# Patient Record
Sex: Female | Born: 1955 | Race: White | Hispanic: No | Marital: Married | Smoking: Never smoker
Health system: Southern US, Community
[De-identification: ages and names within clinical notes are randomized; demographics above are authoritative.]

## PROBLEM LIST (undated history)

## (undated) DIAGNOSIS — Z87442 Personal history of urinary calculi: Secondary | ICD-10-CM

## (undated) DIAGNOSIS — K439 Ventral hernia without obstruction or gangrene: Secondary | ICD-10-CM

## (undated) DIAGNOSIS — F419 Anxiety disorder, unspecified: Secondary | ICD-10-CM

## (undated) DIAGNOSIS — K219 Gastro-esophageal reflux disease without esophagitis: Secondary | ICD-10-CM

## (undated) DIAGNOSIS — Z8719 Personal history of other diseases of the digestive system: Secondary | ICD-10-CM

## (undated) DIAGNOSIS — R609 Edema, unspecified: Secondary | ICD-10-CM

## (undated) HISTORY — PX: LAPAROSCOPY: SHX197

## (undated) HISTORY — PX: CHOLECYSTECTOMY: SHX55

## (undated) HISTORY — PX: HERNIA REPAIR: SHX51

## (undated) HISTORY — PX: BLADDER SUSPENSION: SHX72

## (undated) HISTORY — PX: ABDOMINAL HERNIA REPAIR: SHX539

---

## 1987-07-19 HISTORY — PX: ABDOMINAL HYSTERECTOMY: SHX81

## 2010-06-20 ENCOUNTER — Emergency Department (HOSPITAL_BASED_OUTPATIENT_CLINIC_OR_DEPARTMENT_OTHER)
Admission: EM | Admit: 2010-06-20 | Discharge: 2010-06-20 | Payer: Self-pay | Source: Home / Self Care | Admitting: Emergency Medicine

## 2011-03-28 DIAGNOSIS — R55 Syncope and collapse: Secondary | ICD-10-CM

## 2012-08-20 ENCOUNTER — Encounter (INDEPENDENT_AMBULATORY_CARE_PROVIDER_SITE_OTHER): Payer: Self-pay | Admitting: Surgery

## 2012-08-23 ENCOUNTER — Ambulatory Visit (INDEPENDENT_AMBULATORY_CARE_PROVIDER_SITE_OTHER): Payer: Self-pay | Admitting: Surgery

## 2012-08-27 ENCOUNTER — Ambulatory Visit (INDEPENDENT_AMBULATORY_CARE_PROVIDER_SITE_OTHER): Payer: BC Managed Care – PPO | Admitting: Surgery

## 2012-08-27 ENCOUNTER — Encounter (INDEPENDENT_AMBULATORY_CARE_PROVIDER_SITE_OTHER): Payer: Self-pay | Admitting: Surgery

## 2012-08-27 VITALS — BP 148/88 | HR 76 | Temp 97.8°F | Resp 16 | Ht 64.5 in | Wt 186.8 lb

## 2012-08-27 DIAGNOSIS — G8929 Other chronic pain: Secondary | ICD-10-CM | POA: Insufficient documentation

## 2012-08-27 DIAGNOSIS — Z9889 Other specified postprocedural states: Secondary | ICD-10-CM | POA: Insufficient documentation

## 2012-08-27 DIAGNOSIS — Z8719 Personal history of other diseases of the digestive system: Secondary | ICD-10-CM | POA: Insufficient documentation

## 2012-08-27 DIAGNOSIS — R1013 Epigastric pain: Secondary | ICD-10-CM

## 2012-08-27 HISTORY — DX: Personal history of other diseases of the digestive system: Z87.19

## 2012-08-27 NOTE — Progress Notes (Signed)
Patient ID: Rita Diaz, female   DOB: 1955/09/04, 57 y.o.   MRN: [id number]  Chief Complaint  Patient presents with  . Hernia    new pt- eval abd wall hernia    HPI [name] is a 57 y.o. female.  Referred by Dr. Leodis Sias, [facility] HPI [name] who is status post open ventral hernia repair with mesh in 2010 by Dr. Ernestine Conrad [place].  Previously she had had a mesh repair of a primary ventral hernia in the 90s.  She has also had a laparoscopic cholecystectomy. For the last couple of years she has had a lot of pain along the sides of her midline incision. It hurts to bend over or to turn. She denies any GI symptoms. She denies any constipation. According to her report, she was reevaluated by Dr. [name] as who stated that his only option was to remove the hernia mesh but that this would likely result in a recurrent hernia. The patient does not remember when she had her last CT scan. We have no records from her previous surgery immediately available. Her primary care physician has referred her to [name] for a second opinion.  History reviewed. No pertinent past medical history.  Past Surgical History  Procedure Laterality Date  . Abdominal hysterectomy  1989  . Abdominal hernia repair  1996, 2008  . Laparoscopy      x3  . Cesarean section  1985  . Bladder suspension    . Cholecystectomy      History reviewed. No pertinent family history.  Social History History  Substance Use Topics  . Smoking status: Never Smoker   . Smokeless tobacco: Not on file  . Alcohol Use: No    Allergies  Allergen Reactions  . Aspirin     No current outpatient prescriptions on file.   No current facility-administered medications for this visit.    Review of Systems Review of Systems  Constitutional: Negative for fever, chills and unexpected weight change.  HENT: Negative for hearing loss, congestion, sore throat, trouble swallowing and voice  change.   Eyes: Negative for visual disturbance.  Respiratory: Negative for cough and wheezing.   Cardiovascular: Negative for chest pain, palpitations and leg swelling.  Gastrointestinal: Positive for abdominal pain. Negative for nausea, vomiting, diarrhea, constipation, blood in stool, abdominal distention and anal bleeding.  Genitourinary: Negative for hematuria, vaginal bleeding and difficulty urinating.  Musculoskeletal: Negative for arthralgias.  Skin: Negative for rash and wound.  Neurological: Negative for seizures, syncope and headaches.  Hematological: Negative for adenopathy. Does not bruise/bleed easily.  Psychiatric/Behavioral: Negative for confusion.    Blood pressure 148/88, pulse 76, temperature 97.8 F (36.6 C), temperature source Temporal, resp. rate 16, height 5' 4.5" (1.638 m), weight 186 lb 12.8 oz (84.732 kg).  Physical Exam Physical Exam WDWN in NAD HEENT:  EOMI, sclera anicteric Neck:  No masses, no thyromegaly Lungs:  CTA bilaterally; normal respiratory effort CV:  Regular rate and rhythm; no murmurs Abd:  +bowel sounds, soft, Healed midline incision. There is a slight bulge in the middle of this incision above the umbilicus. However this bulge does not enlarge with Valsalva maneuver. I cannot palpate a mass behind this area. There is no sign of recurrent ventral hernia physical examination. She is tender along her incision and the surrounding 3 cm in each direction. Ext:  Well-perfused; no edema Skin:  Warm, dry; no sign of jaundice  Data Reviewed We  were able to obtain an operative report from Dr. Ashley Jacobs. We are awaiting a CT scan report. His surgery was on 09/2608. There was a 7 cm defect repaired with 14 x 10 cm mesh. It is unclear exactly what type of mesh was used. The fascia was closed anterior to the mesh with PDS suture.  Assessment    Abdominal pain after ventral hernia repair.  No sign of recurrent ventral hernia on examination.     Plan    We  will try to obtain records and CT scan from Dr. Ashley Jacobs. She has not had a CT scan in several years and we will obtain a CT scan to make sure that the mesh remains in good place and that there is no sign of recurrent hernia. We will contact the patient after we have reviewed the records to see whether she is a CT scan. In the meantime I encouraged her to use the abdominal binder for support.        [name] K. 08/27/2012, 1:29 PM

## 2012-08-29 ENCOUNTER — Ambulatory Visit
Admission: RE | Admit: 2012-08-29 | Discharge: 2012-08-29 | Disposition: A | Discharge status: Home / Self Care | Payer: BC Managed Care – PPO | Source: Ambulatory Visit | Attending: Surgery | Admitting: Surgery

## 2012-08-29 ENCOUNTER — Other Ambulatory Visit (INDEPENDENT_AMBULATORY_CARE_PROVIDER_SITE_OTHER): Payer: Self-pay | Admitting: Surgery

## 2012-08-29 DIAGNOSIS — Z8719 Personal history of other diseases of the digestive system: Secondary | ICD-10-CM

## 2012-08-29 DIAGNOSIS — G8929 Other chronic pain: Secondary | ICD-10-CM

## 2012-08-29 DIAGNOSIS — R1013 Epigastric pain: Secondary | ICD-10-CM

## 2012-08-29 DIAGNOSIS — Z9889 Other specified postprocedural states: Secondary | ICD-10-CM

## 2012-08-29 MED ORDER — IOHEXOL 300 MG/ML  SOLN
100.0000 mL | Freq: Once | INTRAMUSCULAR | Status: AC | PRN
  Administered 2012-08-29: 100 mL via INTRAVENOUS

## 2012-09-03 ENCOUNTER — Telehealth (INDEPENDENT_AMBULATORY_CARE_PROVIDER_SITE_OTHER): Payer: Self-pay | Admitting: General Surgery

## 2012-09-03 NOTE — Telephone Encounter (Signed)
LMOM for patient to call back and ask for Rita Diaz 

## 2012-09-03 NOTE — Telephone Encounter (Signed)
Called patient and told her that you want to talk to the radiologist yourself when you got back in town and she was ok with that, and she know that it will be next week and get a call from you or me

## 2012-09-11 ENCOUNTER — Ambulatory Visit (INDEPENDENT_AMBULATORY_CARE_PROVIDER_SITE_OTHER): Payer: BC Managed Care – PPO | Admitting: Surgery

## 2012-09-11 ENCOUNTER — Encounter (HOSPITAL_COMMUNITY): Payer: Self-pay | Admitting: Pharmacy Technician

## 2012-09-11 ENCOUNTER — Encounter (INDEPENDENT_AMBULATORY_CARE_PROVIDER_SITE_OTHER): Payer: Self-pay | Admitting: General Surgery

## 2012-09-11 ENCOUNTER — Encounter (INDEPENDENT_AMBULATORY_CARE_PROVIDER_SITE_OTHER): Payer: Self-pay | Admitting: Surgery

## 2012-09-11 VITALS — BP 138/82 | HR 72 | Temp 98.4°F | Resp 14 | Ht 64.5 in | Wt 188.2 lb

## 2012-09-11 DIAGNOSIS — G8929 Other chronic pain: Secondary | ICD-10-CM

## 2012-09-11 DIAGNOSIS — R1013 Epigastric pain: Secondary | ICD-10-CM

## 2012-09-11 DIAGNOSIS — Z8719 Personal history of other diseases of the digestive system: Secondary | ICD-10-CM

## 2012-09-11 DIAGNOSIS — Z9889 Other specified postprocedural states: Secondary | ICD-10-CM

## 2012-09-11 NOTE — Progress Notes (Signed)
The patient comes in today for discussion of her CT scan. I had a discussion with the radiologist. We can see the ventral hernia mesh. The left edge of the mesh does not seem to be adherent to the abdominal wall. There may be some fat that has herniated around the edge of the mesh. This is a fairly subtle finding but may explain her symptoms. She agrees that most of her symptoms are to the left of her incision. I showed the patient the images on the screen and she understands these findings.  Otherwise the scan was fairly normal.  We discussed options for treatment. I offered the patient a laparoscopic exploration with possible revision of her hernia repair. Hopefully we can reduce the fat that is herniated around the left edge of her mesh and can resecure the mesh to the fascia. I explained the possibility for a need to convert to an open procedure if there is too much intra-abdominal scar tissue to allow proper visualization with the laparoscope.The surgical procedure has been discussed with the patient.  Potential risks, benefits, alternative treatments, and expected outcomes have been explained.  All of the patient's questions at this time have been answered.  The likelihood of reaching the patient's treatment goal is good.  The patient understand the proposed surgical procedure and wishes to proceed.  Vernon Ariel K. Shakerria Parran, MD, FACS [facility]  09/11/2012 11:50 AM       Old note 08/27/12   Patient ID: [place] T Noble, female DOB: 09/19/1955, 57 y.o. MRN: [id number]  Chief Complaint   Patient presents with   .  Hernia     new pt- eval abd wall hernia   HPI  Tanee T Quimby is a 57 y.o. female. Referred by Dr. Francis Wong, [facility]  HPI  [name] who is status post open ventral hernia repair with mesh in 2010 [place]. Previously she had had a mesh repair of a primary ventral hernia in the 90s. She has also had a  laparoscopic cholecystectomy. For the last couple of years she has had a lot of pain along the sides of her midline incision. It hurts to bend over or to turn. She denies any GI symptoms. She denies any constipation. According to her report, she was reevaluated by Dr. Ray as who stated that his only option was to remove the hernia mesh but that this would likely result in a recurrent hernia. The patient does not remember when she had her last CT scan. We have no records from her previous surgery immediately available. Her primary care physician has referred her to us for a second opinion.  History reviewed. No pertinent past medical history.  Past Surgical History   Procedure  Laterality  Date   .  Abdominal hysterectomy   1989   .  Abdominal hernia repair   1996, 2008   .  Laparoscopy       x3   .  Cesarean section   1985   .  Bladder suspension     .  Cholecystectomy     History reviewed. No pertinent family history.  Social History  History   Substance Use Topics   .  Smoking status:  Never Smoker   .  Smokeless tobacco:  Not on file   .  Alcohol Use:  No    Allergies   Allergen  Reactions   .  Aspirin       No current outpatient prescriptions on file.    No current facility-administered medications for this visit.   Review of Systems  Review of Systems  Constitutional: Negative for fever, chills and unexpected weight change.  HENT: Negative for hearing loss, congestion, sore throat, trouble swallowing and voice change.  Eyes: Negative for visual disturbance.  Respiratory: Negative for cough and wheezing.  Cardiovascular: Negative for chest pain, palpitations and leg swelling.  Gastrointestinal: Positive for abdominal pain. Negative for nausea, vomiting, diarrhea, constipation, blood in stool, abdominal distention and anal bleeding.  Genitourinary: Negative for hematuria, vaginal bleeding and difficulty urinating.  Musculoskeletal: Negative for arthralgias.  Skin: Negative for  rash and wound.  Neurological: Negative for seizures, syncope and headaches.  Hematological: Negative for adenopathy. Does not bruise/bleed easily.  Psychiatric/Behavioral: Negative for confusion.  Blood pressure 148/88, pulse 76, temperature 97.8 F (36.6 C), temperature source Temporal, resp. rate 16, height 5' 4.5" (1.638 m), weight 186 lb 12.8 oz (84.732 kg).  Physical Exam  Physical Exam  WDWN in NAD  HEENT: EOMI, sclera anicteric  Neck: No masses, no thyromegaly  Lungs: CTA bilaterally; normal respiratory effort  CV: Regular rate and rhythm; no murmurs  Abd: +bowel sounds, soft, Healed midline incision. There is a slight bulge in the middle of this incision above the umbilicus. However this bulge does not enlarge with Valsalva maneuver. I cannot palpate a mass behind this area. There is no sign of recurrent ventral hernia physical examination. She is tender along her incision and the surrounding 3 cm in each direction.  Ext: Well-perfused; no edema  Skin: Warm, dry; no sign of jaundice  Data Reviewed  We were able to obtain an operative report from Dr. Reyes. We are awaiting a CT scan report. His surgery was on 09/2608. There was a 7 cm defect repaired with 14 x 10 cm mesh. It is unclear exactly what type of mesh was used. The fascia was closed anterior to the mesh with PDS suture.  Assessment  Abdominal pain after ventral hernia repair. No sign of recurrent ventral hernia on examination.  Plan  We will try to obtain records and CT scan from Dr. Reyes. She has not had a CT scan in several years and we will obtain a CT scan to make sure that the mesh remains in good place and that there is no sign of recurrent hernia. We will contact the patient after we have reviewed the records to see whether she is a CT scan. In the meantime I encouraged her to use the abdominal binder for support.  Paitlyn Mcclatchey K.  08/27/2012, 1:29 PM  

## 2012-09-17 ENCOUNTER — Other Ambulatory Visit (HOSPITAL_COMMUNITY): Payer: BC Managed Care – PPO

## 2012-09-18 ENCOUNTER — Encounter (HOSPITAL_COMMUNITY): Payer: Self-pay

## 2012-09-18 ENCOUNTER — Encounter (HOSPITAL_COMMUNITY)
Admission: RE | Admit: 2012-09-18 | Discharge: 2012-09-18 | Disposition: A | Discharge status: Home / Self Care | Payer: BC Managed Care – PPO | Source: Ambulatory Visit | Attending: Surgery | Admitting: Surgery

## 2012-09-18 ENCOUNTER — Other Ambulatory Visit (INDEPENDENT_AMBULATORY_CARE_PROVIDER_SITE_OTHER): Payer: Self-pay | Admitting: Surgery

## 2012-09-18 HISTORY — DX: Gastro-esophageal reflux disease without esophagitis: K21.9

## 2012-09-18 HISTORY — DX: Personal history of other diseases of the digestive system: Z87.19

## 2012-09-18 LAB — CBC
Hemoglobin: 14.5 g/dL (ref 12.0–15.0)
MCH: 30.3 pg (ref 26.0–34.0)
MCHC: 35.3 g/dL (ref 30.0–36.0)
WBC: 5.6 10*3/uL (ref 4.0–10.5)

## 2012-09-18 MED ORDER — VANCOMYCIN HCL 10 G IV SOLR: 1000.0000 mg | Freq: Once | INTRAVENOUS | Status: DC

## 2012-09-18 MED ORDER — CEFAZOLIN SODIUM-DEXTROSE 2-3 GM-% IV SOLR
2.0000 g | INTRAVENOUS | Status: AC
  Administered 2012-09-19: 2 g via INTRAVENOUS
  Filled 2012-09-18: qty 50

## 2012-09-18 NOTE — Pre-Procedure Instructions (Signed)
Rita Diaz  09/18/2012   Your procedure is scheduled on:  Wednesday, March 5th.  Report to [facility] at 6:30 AM.  Call this number if you have problems the morning of surgery: [phone]   Remember:   Do not eat food or drink liquids after midnight.   Take these medicines the morning of surgery with A SIP OF WATER: None.  Stop taking Aspirin, Coumadin, Plavix, Effient and Herbal medications.  Do not take any NSAIDs ie: Ibuprofen,  Advil,Naproxen or any medication containing Aspirin.   Do not wear jewelry, make-up or nail polish.  Do not wear lotions, powders, or perfumes. You may wear deodorant.  Do not shave 48 hours prior to surgery. Men may shave face and neck.  Do not bring valuables to the hospital.  Contacts, dentures or bridgework may not be worn into surgery.  Leave suitcase in the car. After surgery it may be brought to your room.  For patients admitted to the hospital, checkout time is 11:00 AM the day of  discharge.   Patients discharged the day of surgery will not be allowed to drive   home.  Name and phone number of your driver: -   Special Instructions: Shower with CHG wash (Bactoshield) tonight and again in the am prior to arriving to hospital.   Please read over the following fact sheets that you were given: Pain Booklet, Coughing and Deep Breathing and Surgical Site Infection Prevention

## 2012-09-18 NOTE — Progress Notes (Signed)
Pre-op screening positive for MRSA.  Vancomycin 1 gram prior to surgery in addition to Ancef.  Wilmon Arms. Corliss Skains, MD, [facility]  09/18/2012 4:06 PM

## 2012-09-19 ENCOUNTER — Observation Stay (HOSPITAL_COMMUNITY)
Admission: RE | Admit: 2012-09-19 | Discharge: 2012-09-20 | Disposition: A | Discharge status: Home / Self Care | Payer: BC Managed Care – PPO | Source: Ambulatory Visit | Attending: Surgery | Admitting: Surgery

## 2012-09-19 ENCOUNTER — Encounter (HOSPITAL_COMMUNITY)
Admission: RE | Disposition: A | Discharge status: Home / Self Care | Payer: Self-pay | Source: Ambulatory Visit | Attending: Surgery

## 2012-09-19 ENCOUNTER — Encounter (HOSPITAL_COMMUNITY): Payer: Self-pay | Admitting: Anesthesiology

## 2012-09-19 ENCOUNTER — Ambulatory Visit (HOSPITAL_COMMUNITY): Payer: BC Managed Care – PPO | Admitting: Anesthesiology

## 2012-09-19 DIAGNOSIS — K660 Peritoneal adhesions (postprocedural) (postinfection): Secondary | ICD-10-CM

## 2012-09-19 DIAGNOSIS — Z01812 Encounter for preprocedural laboratory examination: Secondary | ICD-10-CM | POA: Insufficient documentation

## 2012-09-19 DIAGNOSIS — R1012 Left upper quadrant pain: Secondary | ICD-10-CM | POA: Insufficient documentation

## 2012-09-19 DIAGNOSIS — G8929 Other chronic pain: Secondary | ICD-10-CM

## 2012-09-19 HISTORY — PX: VENTRAL HERNIA REPAIR: SHX424

## 2012-09-19 HISTORY — PX: LAPAROSCOPIC LYSIS OF ADHESIONS: SHX5905

## 2012-09-19 SURGERY — LYSIS, ADHESIONS, LAPAROSCOPIC
Anesthesia: General | Site: Abdomen | Wound class: Clean

## 2012-09-19 MED ORDER — CHLORHEXIDINE GLUCONATE 4 % EX LIQD: 1.0000 "application " | Freq: Once | CUTANEOUS | Status: DC

## 2012-09-19 MED ORDER — SODIUM CHLORIDE 0.9 % IV SOLN
INTRAVENOUS | Status: DC
  Administered 2012-09-19 – 2012-09-20 (×2): via INTRAVENOUS

## 2012-09-19 MED ORDER — HYDROMORPHONE HCL PF 1 MG/ML IJ SOLN
INTRAMUSCULAR | Status: AC
  Filled 2012-09-19: qty 1

## 2012-09-19 MED ORDER — ONDANSETRON HCL 4 MG PO TABS: 4.0000 mg | ORAL_TABLET | Freq: Four times a day (QID) | ORAL | Status: DC | PRN

## 2012-09-19 MED ORDER — OXYCODONE-ACETAMINOPHEN 5-325 MG PO TABS: 1.0000 | ORAL_TABLET | ORAL | Status: DC | PRN

## 2012-09-19 MED ORDER — PROPOFOL 10 MG/ML IV BOLUS
INTRAVENOUS | Status: DC | PRN
  Administered 2012-09-19: 180 mg via INTRAVENOUS

## 2012-09-19 MED ORDER — HYDROMORPHONE HCL PF 1 MG/ML IJ SOLN
1.0000 mg | INTRAMUSCULAR | Status: DC | PRN
  Administered 2012-09-19: 1 mg via INTRAVENOUS
  Filled 2012-09-19: qty 1

## 2012-09-19 MED ORDER — ROCURONIUM BROMIDE 100 MG/10ML IV SOLN
INTRAVENOUS | Status: DC | PRN
  Administered 2012-09-19: 5 mg via INTRAVENOUS
  Administered 2012-09-19: 40 mg via INTRAVENOUS
  Administered 2012-09-19: 5 mg via INTRAVENOUS

## 2012-09-19 MED ORDER — ONDANSETRON HCL 4 MG/2ML IJ SOLN
INTRAMUSCULAR | Status: DC | PRN
  Administered 2012-09-19: 4 mg via INTRAVENOUS

## 2012-09-19 MED ORDER — HEMOSTATIC AGENTS (NO CHARGE) OPTIME
TOPICAL | Status: DC | PRN
  Administered 2012-09-19: 1 via TOPICAL

## 2012-09-19 MED ORDER — ONDANSETRON HCL 4 MG/2ML IJ SOLN
INTRAMUSCULAR | Status: AC
  Administered 2012-09-19: 4 mg
  Filled 2012-09-19: qty 2

## 2012-09-19 MED ORDER — KETOROLAC TROMETHAMINE 30 MG/ML IJ SOLN
INTRAMUSCULAR | Status: AC
  Administered 2012-09-19: 30 mg
  Filled 2012-09-19: qty 1

## 2012-09-19 MED ORDER — KETOROLAC TROMETHAMINE 15 MG/ML IJ SOLN
30.0000 mg | Freq: Four times a day (QID) | INTRAMUSCULAR | Status: DC
  Administered 2012-09-19 – 2012-09-20 (×3): 30 mg via INTRAVENOUS
  Filled 2012-09-19: qty 1
  Filled 2012-09-19: qty 2
  Filled 2012-09-19: qty 1
  Filled 2012-09-19: qty 2
  Filled 2012-09-19: qty 1
  Filled 2012-09-19 (×3): qty 2
  Filled 2012-09-19: qty 1

## 2012-09-19 MED ORDER — BUPIVACAINE-EPINEPHRINE PF 0.25-1:200000 % IJ SOLN
INTRAMUSCULAR | Status: AC
  Filled 2012-09-19: qty 30

## 2012-09-19 MED ORDER — EPHEDRINE SULFATE 50 MG/ML IJ SOLN
INTRAMUSCULAR | Status: DC | PRN
  Administered 2012-09-19: 10 mg via INTRAVENOUS

## 2012-09-19 MED ORDER — LIDOCAINE HCL (CARDIAC) 20 MG/ML IV SOLN
INTRAVENOUS | Status: DC | PRN
  Administered 2012-09-19: 100 mg via INTRAVENOUS

## 2012-09-19 MED ORDER — BUPIVACAINE-EPINEPHRINE 0.25% -1:200000 IJ SOLN
INTRAMUSCULAR | Status: DC | PRN
  Administered 2012-09-19: 28 mL

## 2012-09-19 MED ORDER — VANCOMYCIN HCL IN DEXTROSE 1-5 GM/200ML-% IV SOLN
INTRAVENOUS | Status: AC
  Administered 2012-09-19: 1000 mg via INTRAVENOUS
  Filled 2012-09-19: qty 200

## 2012-09-19 MED ORDER — HYDROMORPHONE HCL PF 1 MG/ML IJ SOLN
0.2500 mg | INTRAMUSCULAR | Status: DC | PRN
  Administered 2012-09-19 (×2): 0.5 mg via INTRAVENOUS

## 2012-09-19 MED ORDER — MUPIROCIN 2 % EX OINT
TOPICAL_OINTMENT | Freq: Two times a day (BID) | CUTANEOUS | Status: DC
  Administered 2012-09-19 (×2): via NASAL
  Filled 2012-09-19 (×2): qty 22

## 2012-09-19 MED ORDER — SODIUM CHLORIDE 0.9 % IR SOLN
Status: DC | PRN
  Administered 2012-09-19: 1000 mL

## 2012-09-19 MED ORDER — MIDAZOLAM HCL 5 MG/5ML IJ SOLN
INTRAMUSCULAR | Status: DC | PRN
  Administered 2012-09-19: 2 mg via INTRAVENOUS

## 2012-09-19 MED ORDER — NEOSTIGMINE METHYLSULFATE 1 MG/ML IJ SOLN
INTRAMUSCULAR | Status: DC | PRN
  Administered 2012-09-19: 3 mg via INTRAVENOUS

## 2012-09-19 MED ORDER — GLYCOPYRROLATE 0.2 MG/ML IJ SOLN
INTRAMUSCULAR | Status: DC | PRN
  Administered 2012-09-19: 0.4 mg via INTRAVENOUS

## 2012-09-19 MED ORDER — FENTANYL CITRATE 0.05 MG/ML IJ SOLN
INTRAMUSCULAR | Status: DC | PRN
  Administered 2012-09-19 (×2): 50 ug via INTRAVENOUS

## 2012-09-19 MED ORDER — 0.9 % SODIUM CHLORIDE (POUR BTL) OPTIME
TOPICAL | Status: DC | PRN
  Administered 2012-09-19 (×2): 1000 mL

## 2012-09-19 MED ORDER — LACTATED RINGERS IV SOLN
INTRAVENOUS | Status: DC | PRN
  Administered 2012-09-19 (×2): via INTRAVENOUS

## 2012-09-19 MED ORDER — MUPIROCIN 2 % EX OINT
TOPICAL_OINTMENT | CUTANEOUS | Status: AC
  Filled 2012-09-19: qty 22

## 2012-09-19 MED ORDER — ONDANSETRON HCL 4 MG/2ML IJ SOLN: 4.0000 mg | Freq: Four times a day (QID) | INTRAMUSCULAR | Status: DC | PRN

## 2012-09-19 SURGICAL SUPPLY — 52 items
APPLICATOR COTTON TIP 6IN STRL (MISCELLANEOUS) ×3 IMPLANT
APPLIER CLIP LOGIC TI 5 (MISCELLANEOUS) IMPLANT
APPLIER CLIP ROT 10 11.4 M/L (STAPLE)
BINDER ABD UNIV 12 45-62 (WOUND CARE) IMPLANT
BINDER ABDOMINAL 46IN 62IN (WOUND CARE)
BLADE SURG ROTATE 9660 (MISCELLANEOUS) IMPLANT
CANISTER SUCTION 2500CC (MISCELLANEOUS) ×3 IMPLANT
CHLORAPREP W/TINT 26ML (MISCELLANEOUS) ×3 IMPLANT
CLIP APPLIE ROT 10 11.4 M/L (STAPLE) IMPLANT
CLOTH BEACON ORANGE TIMEOUT ST (SAFETY) ×3 IMPLANT
COVER SURGICAL LIGHT HANDLE (MISCELLANEOUS) ×3 IMPLANT
DECANTER SPIKE VIAL GLASS SM (MISCELLANEOUS) IMPLANT
DERMABOND ADHESIVE PROPEN (GAUZE/BANDAGES/DRESSINGS) ×1
DERMABOND ADVANCED .7 DNX6 (GAUZE/BANDAGES/DRESSINGS) ×2 IMPLANT
DEVICE SECURE STRAP 25 ABSORB (INSTRUMENTS) IMPLANT
DEVICE TROCAR PUNCTURE CLOSURE (ENDOMECHANICALS) ×3 IMPLANT
DRAPE UTILITY 15X26 W/TAPE STR (DRAPE) ×6 IMPLANT
DRAPE WARM FLUID 44X44 (DRAPE) ×3 IMPLANT
ELECT REM PT RETURN 9FT ADLT (ELECTROSURGICAL) ×3
ELECTRODE REM PT RTRN 9FT ADLT (ELECTROSURGICAL) ×2 IMPLANT
FILTER SMOKE EVAC LAPAROSHD (FILTER) ×3 IMPLANT
GLOVE BIO SURGEON STRL SZ7 (GLOVE) ×3 IMPLANT
GLOVE BIO SURGEON STRL SZ7.5 (GLOVE) ×3 IMPLANT
GLOVE BIOGEL PI IND STRL 7.0 (GLOVE) ×4 IMPLANT
GLOVE BIOGEL PI IND STRL 7.5 (GLOVE) ×6 IMPLANT
GLOVE BIOGEL PI INDICATOR 7.0 (GLOVE) ×2
GLOVE BIOGEL PI INDICATOR 7.5 (GLOVE) ×3
GLOVE SURG SS PI 7.0 STRL IVOR (GLOVE) ×3 IMPLANT
GOWN STRL NON-REIN LRG LVL3 (GOWN DISPOSABLE) ×9 IMPLANT
HEMOSTAT SURGICEL 2X14 (HEMOSTASIS) ×3 IMPLANT
KIT BASIN OR (CUSTOM PROCEDURE TRAY) ×3 IMPLANT
KIT ROOM TURNOVER OR (KITS) ×3 IMPLANT
MARKER SKIN DUAL TIP RULER LAB (MISCELLANEOUS) ×3 IMPLANT
NEEDLE SPNL 22GX3.5 QUINCKE BK (NEEDLE) ×3 IMPLANT
NS IRRIG 1000ML POUR BTL (IV SOLUTION) ×6 IMPLANT
PAD ARMBOARD 7.5X6 YLW CONV (MISCELLANEOUS) ×6 IMPLANT
SCALPEL HARMONIC ACE (MISCELLANEOUS) ×3 IMPLANT
SCISSORS LAP 5X35 DISP (ENDOMECHANICALS) ×3 IMPLANT
SET IRRIG TUBING LAPAROSCOPIC (IRRIGATION / IRRIGATOR) ×3 IMPLANT
SLEEVE ENDOPATH XCEL 5M (ENDOMECHANICALS) ×9 IMPLANT
SUT MNCRL AB 4-0 PS2 18 (SUTURE) ×3 IMPLANT
SUT NOVA NAB GS-21 0 18 T12 DT (SUTURE) ×3 IMPLANT
TOWEL OR 17X24 6PK STRL BLUE (TOWEL DISPOSABLE) ×3 IMPLANT
TOWEL OR 17X26 10 PK STRL BLUE (TOWEL DISPOSABLE) ×3 IMPLANT
TOWEL OR NON WOVEN STRL DISP B (DISPOSABLE) ×3 IMPLANT
TRAY FOLEY CATH 14FR (SET/KITS/TRAYS/PACK) ×3 IMPLANT
TRAY FOLEY CATH 14FRSI W/METER (CATHETERS) IMPLANT
TRAY LAPAROSCOPIC (CUSTOM PROCEDURE TRAY) ×3 IMPLANT
TROCAR XCEL BLUNT TIP 100MML (ENDOMECHANICALS) IMPLANT
TROCAR XCEL NON-BLD 11X100MML (ENDOMECHANICALS) IMPLANT
TROCAR XCEL NON-BLD 5MMX100MML (ENDOMECHANICALS) ×3 IMPLANT
WATER STERILE IRR 1000ML POUR (IV SOLUTION) IMPLANT

## 2012-09-19 NOTE — Transfer of Care (Signed)
Immediate Anesthesia Transfer of Care Note  Patient: Rita Diaz  Procedure(s) Performed: Procedure(s): LAPAROSCOPIC LYSIS OF ADHESIONS (N/A)  Patient Location: PACU  Anesthesia Type:General  Level of Consciousness: awake, alert  and oriented  Airway & Oxygen Therapy: Patient Spontanous Breathing and Patient connected to nasal cannula oxygen  Post-op Assessment: Report given to PACU RN and Post -op Vital signs reviewed and stable  Post vital signs: Reviewed and stable  Complications: No apparent anesthesia complications

## 2012-09-19 NOTE — Op Note (Signed)
Preop diagnosis: Severe left upper quadrant abdominal pain with possible recurrent ventral hernia Postop diagnosis: Extensive intra-abdominal adhesions with no sign of recurrent ventral hernia Procedure performed: Laparoscopic lysis of adhesions greater than 90 minutes Surgeon:TSUEI,MATTHEW K. Anesthesia: Gen. Endotracheal Indications: This is a 77 shows female who is status post open ventral hernia repair with mesh. This was performed by another surgeon at [facility]. The patient has experienced worsening pain in her left upper quadrant.  A CT scan showed no sign of recurrent hernia but there was some question whether the edge of the mesh was adherent to the abdominal wall. I offered her a diagnostic laparoscopy with examination of the mesh.  Description of procedure: The patient was brought to the operating room and placed in the supine position on the operating room table. After an adequate level of general anesthesia was obtained, a Foley catheter was placed under sterile technique. The patient's abdomen was prepped with chlor prep and draped in sterile fashion. A timeout was taken to ensure the proper patient proper procedure. We infiltrated the area below the left costal margin with quarter percent Marcaine and made a 5 mm incision.A 5 mm Optiview trocar was used to cannulate the peritoneal cavity. We were able to obtain insufflation of the abdomen but upon entering with the laparoscope it was obvious that there were a lot of adhesions in this area. I cannot find a clear spot. Therefore we did another 5 mm Optiview trocar in the right upper quadrant. We entered the peritoneal cavity. There were minimal adhesions on the right side. We placed 2 additional 5 mm ports on the patient's right side. We then spent the next hour and a half lysing adhesions from the anterior abdominal wall. The omentum was densely adherent to the mesh that was placed. The mesh seem to be intact and there was no  sign of recurrent hernia. In the left upper quadrant the left lobe of the liver who was densely adherent to the upper edge of the mesh. We used the harmonic scalpel to take the liver down away from the mesh. We also divided the falciform ligament. Once we had cleared the anterior abdominal wall we examined the liver. There was some disruption of the anterior capsule and this area was packed with Surgicel. We carefully examined the edges of the hernia mesh and that did not seem to be any recurrent hernia. We irrigated thoroughly and inspected for hemostasis. We infiltrated all port sites with quarter percent Marcaine with epinephrine. Pneumoperitoneum was released as the trocars were removed. 4 Monocryl was used to close the skin. Dermabond was used to seal the incisions. The patient was then extubated and brought to recovery in stable condition. All sponge, instrument, and needle counts are correct.  Wilmon Arms. Corliss Skains, MD, [facility]  09/19/2012 10:37 AM

## 2012-09-19 NOTE — Progress Notes (Signed)
Orthopedic Tech Progress Note Patient Details:  Rita Diaz [birth year] [id number]  Ortho Devices Type of Ortho Device: Abdominal binder Ortho Device/Splint Interventions: Application   Cammer, Mickie Bail 09/19/2012, 1:21 PM

## 2012-09-19 NOTE — Anesthesia Postprocedure Evaluation (Signed)
  Anesthesia Post-op Note  Patient: Rita Diaz  Procedure(s) Performed: Procedure(s): LAPAROSCOPIC LYSIS OF ADHESIONS (N/A)  Patient Location: PACU  Anesthesia Type:General  Level of Consciousness: awake  Airway and Oxygen Therapy: Patient Spontanous Breathing  Post-op Pain: mild  Post-op Assessment: Post-op Vital signs reviewed  Post-op Vital Signs: Reviewed  Complications: No apparent anesthesia complications

## 2012-09-19 NOTE — Interval H&P Note (Signed)
History and Physical Interval Note:  09/19/2012 7:52 AM  Rita Diaz  has presented today for surgery, with the diagnosis of recurrent ventral hernia  The various methods of treatment have been discussed with the patient and family. After consideration of risks, benefits and other options for treatment, the patient has consented to  Procedure(s): LAPAROSCOPIC RECURRENT  VENTRAL HERNIA , POSSIBLE OPEN REPAIR WITH MESH (N/A) INSERTION OF MESH (N/A) as a surgical intervention .  The patient's history has been reviewed, patient examined, no change in status, stable for surgery.  I have reviewed the patient's chart and labs.  Questions were answered to the patient's satisfaction.     Annalea Alguire K.

## 2012-09-19 NOTE — Preoperative (Signed)
Beta Blockers   Reason not to administer Beta Blockers:Not Applicable 

## 2012-09-19 NOTE — Anesthesia Preprocedure Evaluation (Addendum)
Anesthesia Evaluation  Patient identified by MRN, date of birth, ID band Patient awake    Reviewed: Allergy & Precautions, H&P , NPO status , Patient's Chart, lab work & pertinent test results  Airway Mallampati: II TM Distance: >3 FB Neck ROM: Full    Dental  (+) Teeth Intact and Dental Advisory Given   Pulmonary neg pulmonary ROS,  breath sounds clear to auscultation        Cardiovascular negative cardio ROS  Rhythm:Regular Rate:Normal     Neuro/Psych    GI/Hepatic Neg liver ROS, hiatal hernia, GERD-  ,  Endo/Other  negative endocrine ROS  Renal/GU Renal disease     Musculoskeletal   Abdominal   Peds  Hematology negative hematology ROS (+)   Anesthesia Other Findings   Reproductive/Obstetrics                          Anesthesia Physical Anesthesia Plan  ASA: III  Anesthesia Plan: General   Post-op Pain Management:    Induction:   Airway Management Planned: Oral ETT  Additional Equipment:   Intra-op Plan:   Post-operative Plan: Extubation in OR  Informed Consent: I have reviewed the patients History and Physical, chart, labs and discussed the procedure including the risks, benefits and alternatives for the proposed anesthesia with the patient or authorized representative who has indicated his/her understanding and acceptance.     Plan Discussed with: CRNA, Anesthesiologist and Surgeon  Anesthesia Plan Comments:         Anesthesia Quick Evaluation

## 2012-09-19 NOTE — H&P (View-Only) (Signed)
The patient comes in today for discussion of her CT scan. I had a discussion with the radiologist. We can see the ventral hernia mesh. The left edge of the mesh does not seem to be adherent to the abdominal wall. There may be some fat that has herniated around the edge of the mesh. This is a fairly subtle finding but may explain her symptoms. She agrees that most of her symptoms are to the left of her incision. I showed the patient the images on the screen and she understands these findings.  Otherwise the scan was fairly normal.  We discussed options for treatment. I offered the patient a laparoscopic exploration with possible revision of her hernia repair. Hopefully we can reduce the fat that is herniated around the left edge of her mesh and can resecure the mesh to the fascia. I explained the possibility for a need to convert to an open procedure if there is too much intra-abdominal scar tissue to allow proper visualization with the laparoscope.The surgical procedure has been discussed with the patient.  Potential risks, benefits, alternative treatments, and expected outcomes have been explained.  All of the patient's questions at this time have been answered.  The likelihood of reaching the patient's treatment goal is good.  The patient understand the proposed surgical procedure and wishes to proceed.  Wilmon Arms. Corliss Skains, MD, [facility]  09/11/2012 11:50 AM       Old note 08/27/12   Patient ID: Rita Diaz, female DOB: 04/24/1956, 57 y.o. MRN: [id number]  Chief Complaint   Patient presents with   .  Hernia     new pt- eval abd wall hernia   HPI  Rita Diaz is a 57 y.o. female. Referred by Dr. Leodis Sias, [facility]  HPI  [name] who is status post open ventral hernia repair with mesh in 2010 by Dr. Ernestine Conrad [place]. Previously she had had a mesh repair of a primary ventral hernia in the 90s. She has also had a  laparoscopic cholecystectomy. For the last couple of years she has had a lot of pain along the sides of her midline incision. It hurts to bend over or to turn. She denies any GI symptoms. She denies any constipation. According to her report, she was reevaluated by Dr. [name] as who stated that his only option was to remove the hernia mesh but that this would likely result in a recurrent hernia. The patient does not remember when she had her last CT scan. We have no records from her previous surgery immediately available. Her primary care physician has referred her to [name] for a second opinion.  History reviewed. No pertinent past medical history.  Past Surgical History   Procedure  Laterality  Date   .  Abdominal hysterectomy   1989   .  Abdominal hernia repair   1996, 2008   .  Laparoscopy       x3   .  Cesarean section   1985   .  Bladder suspension     .  Cholecystectomy     History reviewed. No pertinent family history.  Social History  History   Substance Use Topics   .  Smoking status:  Never Smoker   .  Smokeless tobacco:  Not on file   .  Alcohol Use:  No    Allergies   Allergen  Reactions   .  Aspirin  No current outpatient prescriptions on file.    No current facility-administered medications for this visit.   Review of Systems  Review of Systems  Constitutional: Negative for fever, chills and unexpected weight change.  HENT: Negative for hearing loss, congestion, sore throat, trouble swallowing and voice change.  Eyes: Negative for visual disturbance.  Respiratory: Negative for cough and wheezing.  Cardiovascular: Negative for chest pain, palpitations and leg swelling.  Gastrointestinal: Positive for abdominal pain. Negative for nausea, vomiting, diarrhea, constipation, blood in stool, abdominal distention and anal bleeding.  Genitourinary: Negative for hematuria, vaginal bleeding and difficulty urinating.  Musculoskeletal: Negative for arthralgias.  Skin: Negative for  rash and wound.  Neurological: Negative for seizures, syncope and headaches.  Hematological: Negative for adenopathy. Does not bruise/bleed easily.  Psychiatric/Behavioral: Negative for confusion.  Blood pressure 148/88, pulse 76, temperature 97.8 F (36.6 C), temperature source Temporal, resp. rate 16, height 5' 4.5" (1.638 m), weight 186 lb 12.8 oz (84.732 kg).  Physical Exam  Physical Exam  WDWN in NAD  HEENT: EOMI, sclera anicteric  Neck: No masses, no thyromegaly  Lungs: CTA bilaterally; normal respiratory effort  CV: Regular rate and rhythm; no murmurs  Abd: +bowel sounds, soft, Healed midline incision. There is a slight bulge in the middle of this incision above the umbilicus. However this bulge does not enlarge with Valsalva maneuver. I cannot palpate a mass behind this area. There is no sign of recurrent ventral hernia physical examination. She is tender along her incision and the surrounding 3 cm in each direction.  Ext: Well-perfused; no edema  Skin: Warm, dry; no sign of jaundice  Data Reviewed  We were able to obtain an operative report from Dr. Ashley Jacobs. We are awaiting a CT scan report. His surgery was on 09/2608. There was a 7 cm defect repaired with 14 x 10 cm mesh. It is unclear exactly what type of mesh was used. The fascia was closed anterior to the mesh with PDS suture.  Assessment  Abdominal pain after ventral hernia repair. No sign of recurrent ventral hernia on examination.  Plan  We will try to obtain records and CT scan from Dr. Ashley Jacobs. She has not had a CT scan in several years and we will obtain a CT scan to make sure that the mesh remains in good place and that there is no sign of recurrent hernia. We will contact the patient after we have reviewed the records to see whether she is a CT scan. In the meantime I encouraged her to use the abdominal binder for support.  Natalyia Innes K.  08/27/2012, 1:29 PM

## 2012-09-20 ENCOUNTER — Encounter (HOSPITAL_COMMUNITY): Payer: Self-pay | Admitting: Surgery

## 2012-09-20 LAB — GLUCOSE, CAPILLARY

## 2012-09-20 MED ORDER — OXYCODONE-ACETAMINOPHEN 5-325 MG PO TABS: 1.0000 | ORAL_TABLET | ORAL | Status: DC | PRN

## 2012-09-20 NOTE — Discharge Summary (Signed)
Physician Discharge Summary  Patient ID: Rita Diaz MRN: [id number] DOB/AGE: 1955/09/26 57 y.o.  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 *   Discharged Condition: good  Hospital Course: Laparoscopic lysis of adhesions - omentum and liver adhered to undersurface of mesh; no sign of recurrent hernia.  POD#1  Patient feeling much better; minimal soreness   Consults: None  Significant Diagnostic Studies: none  Treatments: surgery: as above  Discharge Exam: Blood pressure 118/56, pulse 68, temperature 98.5 F (36.9 C), temperature source Oral, resp. rate 17, height 5\' 4"  (1.626 m), weight 180 lb (81.647 kg), SpO2 95.00%. GI: soft, minimal tenderness Incisions c/d/i - Dermabond  Disposition: Discharge home   Discharge Orders   Future Appointments Provider Department Dept Phone   10/01/2012 9:10 AM Rita Arms. Jovani Colquhoun, [facility], [place] [phone]   Future Orders Complete By Expires     Call Diaz for:  persistant nausea and vomiting  As directed     Call Diaz for:  redness, tenderness, or signs of infection (pain, swelling, redness, odor or green/yellow discharge around incision site)  As directed     Call Diaz for:  severe uncontrolled pain  As directed     Call Diaz for:  temperature >100.4  As directed     Diet general  As directed     Discharge instructions  As directed     Comments:      [facility] LAPAROSCOPIC SURGERY: POST OP INSTRUCTIONS Always review your discharge instruction sheet given to you by the facility where your surgery was performed. IF YOU HAVE DISABILITY OR FAMILY LEAVE FORMS, YOU MUST BRING THEM TO THE OFFICE FOR PROCESSING.   DO NOT GIVE THEM TO YOUR DOCTOR.  A prescription for pain medication will be  given to you upon discharge.  Take your pain medication as prescribed, if needed.  If narcotic pain medicine is not needed, then you may take acetaminophen (Tylenol) or ibuprofen (Advil) as needed. Take your usually prescribed medications unless otherwise directed. If you need a refill on your pain medication, please contact your pharmacy.  They will contact our office to request authorization. Prescriptions will not be filled after 5pm or on week-ends. You should follow a light diet the first few days after arrival home, such as soup and crackers, etc.  Be sure to include lots of fluids daily. Most patients will experience some swelling and bruising in the area of the incisions.  Ice packs will help.  Swelling and bruising can take several days to resolve.  It is common to experience some constipation if taking pain medication after surgery.  Increasing fluid intake and taking a stool softener (such as Colace) will usually help or prevent this problem from occurring.  A mild laxative (Milk of Magnesia or Miralax) should be taken according to package instructions if there are no bowel movements after 48 hours. Unless discharge instructions indicate otherwise, you may remove your bandages 48 hours after surgery, and you may shower at that time.  You will have steri-strips (small skin tapes) in place directly over the incision.  These strips should be left on the skin for 7-10 days.  If your surgeon used skin glue on the incision, you may shower in 24 hours.  The glue will flake off over the next 2-3 weeks.  Any sutures or staples will be removed at the office during your follow-up visit. ACTIVITIES:  You may resume regular (light) daily activities beginning the next day-such as daily self-care, walking, climbing stairs-gradually increasing activities as tolerated.  You may have sexual intercourse when it is comfortable.  Refrain from any heavy lifting or straining until approved by your doctor. You may drive  when you are no longer taking prescription pain medication, you can comfortably wear a seatbelt, and you can safely maneuver your car and apply brakes. RETURN TO WORK:   2-3 weeks You should see your doctor in the office for a follow-up appointment approximately 2-3 weeks after your surgery.  Make sure that you call for this appointment within a day or two after you arrive home to insure a convenient appointment time. OTHER INSTRUCTIONS: ________________________________________________________________________ WHEN TO CALL YOUR DOCTOR: Fever over 101.0 Inability to urinate Continued bleeding from incision. Increased pain, redness, or drainage from the incision. Increasing abdominal pain  The clinic staff is available to answer your questions during regular business hours.  Please don't hesitate to call and ask to speak to one of the nurses for clinical concerns.  If you have a medical emergency, go to the nearest emergency room or call 911.  A surgeon from [facility] is always on call at the hospital. [address], [place], [place]  [address] ? [address] [address], [place], [place]   [address] [phone]  FAX [phone] Web site: [email or web address]    Driving Restrictions  As directed     Comments:      Do not drive while taking pain medications    Increase activity slowly  As directed     May shower / Bathe  As directed     May walk up steps  As directed     No dressing needed  As directed         Medication List    TAKE these medications       ibuprofen 200 MG tablet  Commonly known as:  ADVIL,MOTRIN  Take 200 mg by mouth every 6 (six) hours as needed for pain.     oxyCODONE-acetaminophen 5-325 MG per tablet  Commonly known as:  PERCOCET/ROXICET  Take 1-2 tablets by mouth every 4 (four) hours as needed.           Follow-up Information   Follow up with Rita Diaz In 3 weeks.   Contact information:   [address] [place] [address] [phone]       Signed: [name]. 09/20/2012, 7:01 AM

## 2012-09-20 NOTE — Progress Notes (Signed)
Dishcharged amb with all personal belongings acomp by family.  Pt has copy of home instructions and rx and verbalizes understanding.

## 2012-09-24 ENCOUNTER — Telehealth (INDEPENDENT_AMBULATORY_CARE_PROVIDER_SITE_OTHER): Payer: Self-pay | Admitting: General Surgery

## 2012-09-24 NOTE — Telephone Encounter (Signed)
Pt called to report she had vomiting and diarrhea for a short time yesterday, but none since.  She is tolerating some food today; denies fever and nausea now.  Suggested she use some ibuprofen today, in-between the narcotic pain meds, for muscle tightness and discomfort.

## 2012-09-25 ENCOUNTER — Encounter (INDEPENDENT_AMBULATORY_CARE_PROVIDER_SITE_OTHER): Payer: BC Managed Care – PPO | Admitting: Surgery

## 2012-10-01 ENCOUNTER — Ambulatory Visit (INDEPENDENT_AMBULATORY_CARE_PROVIDER_SITE_OTHER): Payer: BC Managed Care – PPO | Admitting: Surgery

## 2012-10-01 ENCOUNTER — Encounter (INDEPENDENT_AMBULATORY_CARE_PROVIDER_SITE_OTHER): Payer: Self-pay | Admitting: Surgery

## 2012-10-01 VITALS — BP 132/84 | HR 58 | Temp 97.4°F | Ht 64.0 in | Wt 185.6 lb

## 2012-10-01 DIAGNOSIS — Z9889 Other specified postprocedural states: Secondary | ICD-10-CM

## 2012-10-01 DIAGNOSIS — R1013 Epigastric pain: Secondary | ICD-10-CM

## 2012-10-01 DIAGNOSIS — G8929 Other chronic pain: Secondary | ICD-10-CM

## 2012-10-01 MED ORDER — ESOMEPRAZOLE MAGNESIUM 20 MG PO PACK: 20.0000 mg | PACK | Freq: Every day | ORAL | Status: DC

## 2012-10-01 MED ORDER — PROMETHAZINE HCL 12.5 MG PO TABS: 12.5000 mg | ORAL_TABLET | Freq: Four times a day (QID) | ORAL | Status: DC | PRN

## 2012-10-01 NOTE — Progress Notes (Signed)
Status post laparoscopic lysis of adhesions on 09/19/12 for chronic epigastric pain. Her previous hernia repair seemed to be intact. She has significant abdominal adhesions to the posterior surface of the mesh which were all taken down. This included the left lobe liver which was densely adherent to the edge of the mesh. Patient is feeling somewhat better. Her pain is much improved. She is using her abdominal binder. She does report a lot of nausea which is mostly in the morning.  This tends to resolve within a few hours. She is using pain medication sparingly.  Filed Vitals:   10/01/12 0859  BP: 132/84  Pulse: 58  Temp: 97.4 F (36.3 C)   Her incisions are all well-healed no sign of infection. No sign of recurrent hernia. Minimal epigastric tenderness.  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 I gave her prescription for Nexium 20 mg daily which hopefully will help with the reflux and in the morning nausea. I also gave her prescription for when necessary Phenergan.  Wilmon Arms. Corliss Skains, MD, [facility]  10/01/2012 9:29 AM

## 2012-11-16 ENCOUNTER — Encounter (INDEPENDENT_AMBULATORY_CARE_PROVIDER_SITE_OTHER): Payer: Self-pay

## 2012-11-19 ENCOUNTER — Telehealth: Payer: Self-pay | Admitting: Nurse Practitioner

## 2012-11-19 NOTE — Telephone Encounter (Signed)
Really should be seen

## 2012-11-19 NOTE — Telephone Encounter (Signed)
Please advise 

## 2012-11-21 ENCOUNTER — Telehealth: Payer: Self-pay | Admitting: Family Medicine

## 2012-11-21 ENCOUNTER — Encounter: Payer: Self-pay | Admitting: Family Medicine

## 2012-11-21 ENCOUNTER — Ambulatory Visit (INDEPENDENT_AMBULATORY_CARE_PROVIDER_SITE_OTHER): Payer: BC Managed Care – PPO | Admitting: Family Medicine

## 2012-11-21 ENCOUNTER — Ambulatory Visit: Payer: Self-pay

## 2012-11-21 ENCOUNTER — Other Ambulatory Visit: Payer: Self-pay

## 2012-11-21 VITALS — BP 131/75 | HR 69 | Temp 98.2°F | Ht 64.5 in | Wt 187.0 lb

## 2012-11-21 DIAGNOSIS — R300 Dysuria: Secondary | ICD-10-CM

## 2012-11-21 DIAGNOSIS — N200 Calculus of kidney: Secondary | ICD-10-CM

## 2012-11-21 DIAGNOSIS — R350 Frequency of micturition: Secondary | ICD-10-CM

## 2012-11-21 LAB — POCT URINALYSIS DIPSTICK
Bilirubin, UA: NEGATIVE
Blood, UA: NEGATIVE
Glucose, UA: NEGATIVE
Ketones, UA: NEGATIVE
Nitrite, UA: NEGATIVE
pH, UA: 6

## 2012-11-21 LAB — POCT UA - MICROSCOPIC ONLY

## 2012-11-21 MED ORDER — CEPHALEXIN 500 MG PO CAPS: 500.0000 mg | ORAL_CAPSULE | Freq: Three times a day (TID) | ORAL | Status: DC

## 2012-11-21 NOTE — Telephone Encounter (Signed)
Appointment given.

## 2012-11-21 NOTE — Telephone Encounter (Signed)
appt given with Mae

## 2012-11-21 NOTE — Progress Notes (Signed)
  Subjective:    Patient ID: Rita Diaz, female    DOB: 16-Aug-1955, 57 y.o.   MRN: [id number]  HPI DYSURIA Onset:  2-3 weeks  Description: dysuria, increased urinary frequency, mild L sided flank pain. No fevers or chills  Modifying factors: Hx/o kidney stones in the past. Recent ventral hernia repair. Well healed. No issues   Symptoms Urgency:  yes Frequency: yes  Hesitancy:  no Hematuria:  no Flank Pain:  yes Fever: no Nausea/Vomiting:  n Missed LMP: no STD exposure: no Discharge: no Irritants: no Rash: no  Red Flags   More than 3 UTI's last 12 months:  no PMH of  Diabetes or Immunosuppression:  no Renal Disease/Calculi: yes; prior history Urinary Tract Abnormality:  no Instrumentation or Trauma: ventral hernia revision 09/2012       Review of Systems  All other systems reviewed and are negative.       Objective:   Physical Exam  Constitutional: She appears well-developed and well-nourished.  HENT:  Head: Normocephalic and atraumatic.  Eyes: Conjunctivae are normal. Pupils are equal, round, and reactive to light.  Neck: Normal range of motion. Neck supple.  Cardiovascular: Normal rate and regular rhythm.   Pulmonary/Chest: Effort normal.  Abdominal: Soft. Bowel sounds are normal.  + L sided flank pain, mild  No suprapubic tenderness    Musculoskeletal: Normal range of motion.  Neurological: She is alert.  Skin: Skin is warm.    Urinalysis    Component Value Date/Time   BILIRUBINUR neg 11/21/2012 1123   UROBILINOGEN negative 11/21/2012 1123   NITRITE neg 11/21/2012 1123   LEUKOCYTESUR Trace 11/21/2012 1123           Assessment & Plan:  Dysuria and flank pain: No hematuria on UA, though pt with flank pain on affected side that showed 3mm stone from CT 08/2012.  Will clinically treat for uti with keflex.  Urine culture Repeat CT to eval for stone progression.  Discussed general and infectious/GU red flags.  Follow up pending CT scan.       The patient and/or caregiver has been counseled thoroughly with regard to treatment plan and/or medications prescribed including dosage, schedule, interactions, rationale for use, and possible side effects and they verbalize understanding. Diagnoses and expected course of recovery discussed and will return if not improved as expected or if the condition worsens. Patient and/or caregiver verbalized understanding.

## 2012-11-22 ENCOUNTER — Telehealth: Payer: Self-pay | Admitting: Family Medicine

## 2012-11-22 ENCOUNTER — Ambulatory Visit (HOSPITAL_COMMUNITY)
Admission: RE | Admit: 2012-11-22 | Discharge: 2012-11-22 | Disposition: A | Discharge status: Home / Self Care | Payer: BC Managed Care – PPO | Source: Ambulatory Visit | Attending: Family Medicine | Admitting: Family Medicine

## 2012-11-22 DIAGNOSIS — M549 Dorsalgia, unspecified: Secondary | ICD-10-CM | POA: Insufficient documentation

## 2012-11-22 DIAGNOSIS — N209 Urinary calculus, unspecified: Secondary | ICD-10-CM | POA: Insufficient documentation

## 2012-11-22 DIAGNOSIS — R109 Unspecified abdominal pain: Secondary | ICD-10-CM | POA: Insufficient documentation

## 2012-11-22 DIAGNOSIS — Z87442 Personal history of urinary calculi: Secondary | ICD-10-CM | POA: Insufficient documentation

## 2012-11-23 NOTE — Telephone Encounter (Signed)
Fluids , continue with the cephalexin. Recheck if still in pain tomorrow. CT unchanged.3 mm nonobstructing stone left kidney. FW

## 2012-11-23 NOTE — Telephone Encounter (Signed)
Pt.notified

## 2012-11-24 LAB — URINE CULTURE: Colony Count: 50000

## 2012-12-06 ENCOUNTER — Telehealth: Payer: Self-pay | Admitting: Physician Assistant

## 2012-12-06 ENCOUNTER — Encounter: Payer: Self-pay | Admitting: Physician Assistant

## 2012-12-06 ENCOUNTER — Ambulatory Visit (INDEPENDENT_AMBULATORY_CARE_PROVIDER_SITE_OTHER): Payer: BC Managed Care – PPO | Admitting: Physician Assistant

## 2012-12-06 ENCOUNTER — Telehealth: Payer: Self-pay | Admitting: Family Medicine

## 2012-12-06 VITALS — BP 132/79 | HR 62 | Temp 98.2°F | Ht 64.5 in | Wt 188.8 lb

## 2012-12-06 DIAGNOSIS — M549 Dorsalgia, unspecified: Secondary | ICD-10-CM

## 2012-12-06 DIAGNOSIS — N200 Calculus of kidney: Secondary | ICD-10-CM

## 2012-12-06 LAB — POCT UA - MICROSCOPIC ONLY: Crystals, Ur, HPF, POC: NEGATIVE

## 2012-12-06 LAB — POCT URINALYSIS DIPSTICK
Bilirubin, UA: NEGATIVE
Blood, UA: NEGATIVE
Glucose, UA: NEGATIVE
Leukocytes, UA: NEGATIVE
Nitrite, UA: NEGATIVE
Urobilinogen, UA: NEGATIVE
pH, UA: 6.5

## 2012-12-06 NOTE — Patient Instructions (Signed)

## 2012-12-06 NOTE — Telephone Encounter (Signed)
Ok to extend note 

## 2012-12-06 NOTE — Progress Notes (Signed)
Subjective:     Patient ID: Rita Diaz, female   DOB: May 01, 1956, 57 y.o.   MRN: [id number]  HPI Pt with a hx of renal stone Most recently seen here and repeat CT cont to show 3mm stone She was at work today and note L lower back pain and had to leave Sx have improved As noted above + FH and PMH of mult stones She has noted sl hematuria on occ.  Review of Systems  All other systems reviewed and are negative.       Objective:   Physical Exam NAD No CVAT Sl TTP L L-spine Abd- soft, sl TTP LUQ, no masses/HSM UA- no hematuria on dip    Assessment:     L renal stone    Plan:     Push fluids Strainer to pt Work note Referral to [facility] F/U prn

## 2012-12-06 NOTE — Telephone Encounter (Signed)
APPT GIVEN

## 2012-12-06 NOTE — Telephone Encounter (Signed)
Can we put her out 5/23 also

## 2012-12-07 ENCOUNTER — Encounter: Payer: Self-pay | Admitting: Family Medicine

## 2012-12-07 NOTE — Telephone Encounter (Signed)
Letter up front to pick up 

## 2012-12-11 ENCOUNTER — Telehealth: Payer: Self-pay | Admitting: Family Medicine

## 2012-12-11 NOTE — Telephone Encounter (Signed)
?   We did not put her on an antibiotic

## 2012-12-11 NOTE — Telephone Encounter (Signed)
Please advise 

## 2012-12-12 NOTE — Telephone Encounter (Signed)
ATB will not help a stone, needs a f/u with [facility]

## 2012-12-12 NOTE — Telephone Encounter (Signed)
Dr. Alvester Morin put her on cephalexin 500mg  i po tid   Uses [facility]

## 2012-12-13 ENCOUNTER — Telehealth: Payer: Self-pay | Admitting: Family Medicine

## 2012-12-17 NOTE — Telephone Encounter (Signed)
Please advise 

## 2012-12-20 ENCOUNTER — Telehealth: Payer: Self-pay | Admitting: Family Medicine

## 2012-12-21 NOTE — Telephone Encounter (Signed)
Can you do this since Dr.newton is out of the office all this week

## 2012-12-21 NOTE — Telephone Encounter (Signed)
Has she seen the [facility] yet? If so needs to get note from them Activity should not affect the stones

## 2012-12-23 ENCOUNTER — Other Ambulatory Visit: Payer: Self-pay | Admitting: Family Medicine

## 2012-12-28 ENCOUNTER — Other Ambulatory Visit: Payer: Self-pay | Admitting: Family Medicine

## 2012-12-28 DIAGNOSIS — R928 Other abnormal and inconclusive findings on diagnostic imaging of breast: Secondary | ICD-10-CM

## 2013-01-08 ENCOUNTER — Telehealth: Payer: Self-pay | Admitting: Nurse Practitioner

## 2013-01-10 ENCOUNTER — Other Ambulatory Visit: Payer: BC Managed Care – PPO

## 2013-01-21 ENCOUNTER — Ambulatory Visit
Admission: RE | Admit: 2013-01-21 | Discharge: 2013-01-21 | Disposition: A | Discharge status: Home / Self Care | Payer: BC Managed Care – PPO | Source: Ambulatory Visit | Attending: Family Medicine | Admitting: Family Medicine

## 2013-01-21 DIAGNOSIS — R928 Other abnormal and inconclusive findings on diagnostic imaging of breast: Secondary | ICD-10-CM

## 2013-01-22 ENCOUNTER — Other Ambulatory Visit: Payer: Self-pay | Admitting: Family Medicine

## 2013-01-22 ENCOUNTER — Telehealth: Payer: Self-pay | Admitting: Family Medicine

## 2013-01-24 ENCOUNTER — Other Ambulatory Visit: Payer: Self-pay

## 2013-01-24 NOTE — Telephone Encounter (Signed)
Not on med list in epic  Sent over from [facility] with Dr. [place] Blas name on it

## 2013-01-28 ENCOUNTER — Telehealth: Payer: Self-pay

## 2013-01-28 MED ORDER — TAMSULOSIN HCL 0.4 MG PO CAPS: 0.4000 mg | ORAL_CAPSULE | Freq: Every day | ORAL | Status: DC

## 2013-01-28 NOTE — Telephone Encounter (Signed)
Spoke with pt wants tamsulosin refilled for kidney stone that was prescribed by dr Alvester Morin   Per dr Modesto Charon ok to call to [facility] Pt aware

## 2013-02-08 ENCOUNTER — Ambulatory Visit (INDEPENDENT_AMBULATORY_CARE_PROVIDER_SITE_OTHER): Payer: BC Managed Care – PPO | Admitting: Family Medicine

## 2013-02-08 VITALS — BP 119/68 | HR 73 | Temp 97.8°F | Ht 64.5 in | Wt 188.0 lb

## 2013-02-08 DIAGNOSIS — H60399 Other infective otitis externa, unspecified ear: Secondary | ICD-10-CM

## 2013-02-08 DIAGNOSIS — H6123 Impacted cerumen, bilateral: Secondary | ICD-10-CM

## 2013-02-08 DIAGNOSIS — H6120 Impacted cerumen, unspecified ear: Secondary | ICD-10-CM

## 2013-02-08 DIAGNOSIS — H60391 Other infective otitis externa, right ear: Secondary | ICD-10-CM

## 2013-02-08 MED ORDER — NEOMYCIN-COLIST-HC-THONZONIUM 3.3-3-10-0.5 MG/ML OT SUSP: 3.0000 [drp] | Freq: Four times a day (QID) | OTIC | Status: DC

## 2013-02-08 NOTE — Patient Instructions (Addendum)
Otitis Externa Otitis externa is a bacterial or fungal infection of the outer ear canal. This is the area from the eardrum to the outside of the ear. Otitis externa is sometimes called "swimmer's ear." CAUSES  Possible causes of infection include:  Swimming in dirty water.  Moisture remaining in the ear after swimming or bathing.  Mild injury (trauma) to the ear.  Objects stuck in the ear (foreign body).  Cuts or scrapes (abrasions) on the outside of the ear. SYMPTOMS  The first symptom of infection is often itching in the ear canal. Later signs and symptoms may include swelling and redness of the ear canal, ear pain, and yellowish-white fluid (pus) coming from the ear. The ear pain may be worse when pulling on the earlobe. DIAGNOSIS  Your caregiver will perform a physical exam. A sample of fluid may be taken from the ear and examined for bacteria or fungi. TREATMENT  Antibiotic ear drops are often given for 10 to 14 days. Treatment may also include pain medicine or corticosteroids to reduce itching and swelling. PREVENTION   Keep your ear dry. Use the corner of a towel to absorb water out of the ear canal after swimming or bathing.  Avoid scratching or putting objects inside your ear. This can damage the ear canal or remove the protective wax that lines the canal. This makes it easier for bacteria and fungi to grow.  Avoid swimming in lakes, polluted water, or poorly chlorinated pools.  You may use ear drops made of rubbing alcohol and vinegar after swimming. Combine equal parts of white vinegar and alcohol in a bottle. Put 3 or 4 drops into each ear after swimming. HOME CARE INSTRUCTIONS   Apply antibiotic ear drops to the ear canal as prescribed by your caregiver.  Only take over-the-counter or prescription medicines for pain, discomfort, or fever as directed by your caregiver.  If you have diabetes, follow any additional treatment instructions from your caregiver.  Keep all  follow-up appointments as directed by your caregiver. SEEK MEDICAL CARE IF:   You have a fever.  Your ear is still red, swollen, painful, or draining pus after 3 days.  Your redness, swelling, or pain gets worse.  You have a severe headache.  You have redness, swelling, pain, or tenderness in the area behind your ear. MAKE SURE YOU:   Understand these instructions.  Will watch your condition.  Will get help right away if you are not doing well or get worse. Document Released: 07/04/2005 Document Revised: 09/26/2011 Document Reviewed: 07/21/2011 Exit[employer] Patient Information 2014 [employer].  

## 2013-02-08 NOTE — Progress Notes (Signed)
  Subjective:    Patient ID: Rita Diaz, female    DOB: [birth year], 56 y.o.   MRN: [id number]  HPI This 57 y.o. female presents for evaluation of right ear discomfort x 5 days.     Review of Systems C/o right ear discomfort No chest pain, SOB, HA, dizziness, vision change, N/V, diarrhea, constipation, dysuria, urinary urgency or frequency, myalgias, arthralgias or rash.     Objective:   Physical Exam  Vital signs noted  Well developed well nourished female.  HEENT - Head atraumatic Normocephalic                Eyes - PERRLA, Conjuctiva - clear Sclera- Clear EOMI                Ears - EAC's with cerumen impaction bilateral                           After irrigation Right EAC with erythema and decreased lumen                           Left EAC normal.  TM's normal.                 Throat - oropharanx wnl Respiratory - Lungs CTA bilateral Cardiac - RRR S1 and S2 without murmur       Assessment & Plan:  Cerumen impaction, bilateral - Plan: neomycin-colistin-hydrocortisone-thonzonium (CORTISPORIN-TC) 3.09-17-08-0.5 MG/ML otic suspension  Otitis, externa, infective, right - Plan: neomycin-colistin-hydrocortisone-thonzonium (CORTISPORIN-TC) 3.09-17-08-0.5 MG/ML otic suspension  Follow up PRN

## 2013-02-09 ENCOUNTER — Encounter (HOSPITAL_COMMUNITY): Payer: Self-pay | Admitting: Anesthesiology

## 2013-02-09 ENCOUNTER — Encounter (HOSPITAL_COMMUNITY): Payer: Self-pay | Admitting: Emergency Medicine

## 2013-02-09 ENCOUNTER — Inpatient Hospital Stay (HOSPITAL_COMMUNITY): Payer: BC Managed Care – PPO | Admitting: Anesthesiology

## 2013-02-09 ENCOUNTER — Encounter (HOSPITAL_COMMUNITY)
Admission: EM | Disposition: A | Discharge status: Home / Self Care | Payer: Self-pay | Source: Home / Self Care | Attending: Emergency Medicine

## 2013-02-09 ENCOUNTER — Observation Stay (HOSPITAL_COMMUNITY)
Admission: EM | Admit: 2013-02-09 | Discharge: 2013-02-09 | DRG: 324 | Disposition: A | Discharge status: Home / Self Care | Payer: BC Managed Care – PPO | Attending: Urology | Admitting: Urology

## 2013-02-09 DIAGNOSIS — K219 Gastro-esophageal reflux disease without esophagitis: Secondary | ICD-10-CM | POA: Diagnosis present

## 2013-02-09 DIAGNOSIS — N201 Calculus of ureter: Secondary | ICD-10-CM | POA: Diagnosis not present

## 2013-02-09 DIAGNOSIS — Z79899 Other long term (current) drug therapy: Secondary | ICD-10-CM

## 2013-02-09 DIAGNOSIS — N200 Calculus of kidney: Secondary | ICD-10-CM

## 2013-02-09 DIAGNOSIS — Z87442 Personal history of urinary calculi: Secondary | ICD-10-CM

## 2013-02-09 HISTORY — PX: CYSTOSCOPY WITH RETROGRADE PYELOGRAM, URETEROSCOPY AND STENT PLACEMENT: SHX5789

## 2013-02-09 LAB — POCT I-STAT, CHEM 8
BUN: 11 mg/dL (ref 6–23)
Creatinine, Ser: 1.1 mg/dL (ref 0.50–1.10)
Glucose, Bld: 137 mg/dL — ABNORMAL HIGH (ref 70–99)
Hemoglobin: 12.9 g/dL (ref 12.0–15.0)
Potassium: 3.8 mEq/L (ref 3.5–5.1)
Sodium: 140 mEq/L (ref 135–145)

## 2013-02-09 LAB — URINALYSIS, ROUTINE W REFLEX MICROSCOPIC
Nitrite: NEGATIVE
Protein, ur: NEGATIVE mg/dL
Specific Gravity, Urine: 1.015 (ref 1.005–1.030)
Urobilinogen, UA: 0.2 mg/dL (ref 0.0–1.0)

## 2013-02-09 LAB — MRSA PCR SCREENING: MRSA by PCR: POSITIVE — AB

## 2013-02-09 LAB — URINE MICROSCOPIC-ADD ON

## 2013-02-09 LAB — POCT PREGNANCY, URINE: Preg Test, Ur: NEGATIVE

## 2013-02-09 SURGERY — CYSTOURETEROSCOPY, WITH RETROGRADE PYELOGRAM AND STENT INSERTION
Anesthesia: General | Laterality: Left | Wound class: Clean Contaminated

## 2013-02-09 MED ORDER — LIDOCAINE HCL (CARDIAC) 20 MG/ML IV SOLN
INTRAVENOUS | Status: DC | PRN
  Administered 2013-02-09: 30 mg via INTRAVENOUS

## 2013-02-09 MED ORDER — FENTANYL CITRATE 0.05 MG/ML IJ SOLN: 25.0000 ug | INTRAMUSCULAR | Status: DC | PRN

## 2013-02-09 MED ORDER — ONDANSETRON HCL 4 MG/2ML IJ SOLN
INTRAMUSCULAR | Status: DC | PRN
  Administered 2013-02-09 (×2): 2 mg via INTRAVENOUS

## 2013-02-09 MED ORDER — SULFAMETHOXAZOLE-TMP DS 800-160 MG PO TABS: 1.0000 | ORAL_TABLET | Freq: Every day | ORAL | Status: DC

## 2013-02-09 MED ORDER — LACTATED RINGERS IV SOLN: INTRAVENOUS | Status: DC

## 2013-02-09 MED ORDER — FENTANYL CITRATE 0.05 MG/ML IJ SOLN
INTRAMUSCULAR | Status: DC | PRN
  Administered 2013-02-09: 75 ug via INTRAVENOUS
  Administered 2013-02-09: 25 ug via INTRAVENOUS

## 2013-02-09 MED ORDER — ONDANSETRON HCL 4 MG/2ML IJ SOLN
4.0000 mg | INTRAMUSCULAR | Status: AC
  Administered 2013-02-09: 4 mg via INTRAVENOUS
  Filled 2013-02-09: qty 2

## 2013-02-09 MED ORDER — LIDOCAINE HCL 2 % EX GEL
CUTANEOUS | Status: AC
  Filled 2013-02-09: qty 10

## 2013-02-09 MED ORDER — HYDROMORPHONE HCL PF 1 MG/ML IJ SOLN
1.0000 mg | Freq: Once | INTRAMUSCULAR | Status: AC
  Administered 2013-02-09: 1 mg via INTRAVENOUS
  Filled 2013-02-09: qty 1

## 2013-02-09 MED ORDER — SODIUM CHLORIDE 0.9 % IR SOLN
Status: DC | PRN
  Administered 2013-02-09: 4000 mL

## 2013-02-09 MED ORDER — KETOROLAC TROMETHAMINE 30 MG/ML IJ SOLN
30.0000 mg | Freq: Once | INTRAMUSCULAR | Status: AC
  Administered 2013-02-09: 30 mg via INTRAVENOUS
  Filled 2013-02-09: qty 1

## 2013-02-09 MED ORDER — 0.9 % SODIUM CHLORIDE (POUR BTL) OPTIME
TOPICAL | Status: DC | PRN
  Administered 2013-02-09: 1000 mL

## 2013-02-09 MED ORDER — IOHEXOL 300 MG/ML  SOLN
INTRAMUSCULAR | Status: DC | PRN
  Administered 2013-02-09: 15 mL via URETHRAL

## 2013-02-09 MED ORDER — KETAMINE HCL 10 MG/ML IJ SOLN
INTRAMUSCULAR | Status: DC | PRN
  Administered 2013-02-09: 15 mg via INTRAVENOUS

## 2013-02-09 MED ORDER — HYDROMORPHONE HCL PF 1 MG/ML IJ SOLN
0.5000 mg | INTRAMUSCULAR | Status: DC | PRN
  Administered 2013-02-09: 0.5 mg via INTRAVENOUS
  Filled 2013-02-09: qty 1

## 2013-02-09 MED ORDER — IOHEXOL 300 MG/ML  SOLN
INTRAMUSCULAR | Status: AC
  Filled 2013-02-09: qty 1

## 2013-02-09 MED ORDER — MIDAZOLAM HCL 5 MG/5ML IJ SOLN
INTRAMUSCULAR | Status: DC | PRN
  Administered 2013-02-09: 1 mg via INTRAVENOUS

## 2013-02-09 MED ORDER — PROPOFOL 10 MG/ML IV BOLUS
INTRAVENOUS | Status: DC | PRN
  Administered 2013-02-09: 150 mg via INTRAVENOUS

## 2013-02-09 MED ORDER — LACTATED RINGERS IV SOLN
INTRAVENOUS | Status: DC | PRN
  Administered 2013-02-09: 08:00:00 via INTRAVENOUS

## 2013-02-09 MED ORDER — SODIUM CHLORIDE 0.9 % IV SOLN
Freq: Once | INTRAVENOUS | Status: AC
  Administered 2013-02-09: 150 mL/h via INTRAVENOUS

## 2013-02-09 MED ORDER — CEFTRIAXONE SODIUM 1 G IJ SOLR
1.0000 g | Freq: Once | INTRAMUSCULAR | Status: AC
  Administered 2013-02-09: 1 g via INTRAVENOUS
  Filled 2013-02-09: qty 10

## 2013-02-09 SURGICAL SUPPLY — 19 items
BAG URINE DRAINAGE (UROLOGICAL SUPPLIES) ×2 IMPLANT
BASKET LASER NITINOL 1.9FR (BASKET) IMPLANT
BASKET STNLS GEMINI 4WIRE 3FR (BASKET) IMPLANT
BASKET ZERO TIP NITINOL 2.4FR (BASKET) IMPLANT
CATH INTERMIT  6FR 70CM (CATHETERS) ×4 IMPLANT
DRAPE CAMERA CLOSED 9X96 (DRAPES) ×2 IMPLANT
ELECT REM PT RETURN 9FT ADLT (ELECTROSURGICAL)
ELECTRODE REM PT RTRN 9FT ADLT (ELECTROSURGICAL) IMPLANT
GLOVE BIOGEL M STRL SZ7.5 (GLOVE) ×2 IMPLANT
GOWN PREVENTION PLUS LG XLONG (DISPOSABLE) ×2 IMPLANT
GUIDEWIRE ANG ZIPWIRE 038X150 (WIRE) ×2 IMPLANT
GUIDEWIRE STR DUAL SENSOR (WIRE) ×4 IMPLANT
IV NS IRRIG 3000ML ARTHROMATIC (IV SOLUTION) ×2 IMPLANT
LASER FIBER DISP (UROLOGICAL SUPPLIES) IMPLANT
PACK CYSTO (CUSTOM PROCEDURE TRAY) ×2 IMPLANT
STENT CONTOUR 6FRX24X.038 (STENTS) ×2 IMPLANT
SYRINGE 10CC LL (SYRINGE) IMPLANT
SYRINGE IRR TOOMEY STRL 70CC (SYRINGE) IMPLANT
TUBE FEEDING 8FR 16IN STR KANG (MISCELLANEOUS) ×4 IMPLANT

## 2013-02-09 NOTE — ED Notes (Addendum)
Pt c/o L flank pain x 2 days, CT scan done at [facility] Thursday,  Pt seen in Urology office yesterday. Pt is unable to get pain under control.

## 2013-02-09 NOTE — Op Note (Signed)
NAMEJALYN, Rita Diaz NO.:  [id number]  MEDICAL RECORD NO.:  [id number]  LOCATION:  1401                         FACILITY:  [facility]  PHYSICIAN:  Sebastian Ache, MD     DATE OF BIRTH:  1956-03-10  DATE OF PROCEDURE: 02/09/2013 DATE OF DISCHARGE:                              OPERATIVE REPORT   PREOPERATIVE DIAGNOSES:  Left ureteral stone, refractory flank pain, nausea, vomiting.  POSTOPERATIVE DIAGNOSES:  Left ureteral stone, refractory flank pain, nausea, vomiting.  PROCEDURE:  Cystoscopy with left retrograde pyelogram interpretation, left diagnostic ureteroscopy, insertion of left ureteral stent 6 x 24 with tether to the left thigh.  COMPLICATIONS:  None.  SPECIMENS:  Left ureteral stone for compositional analysis.  FINDINGS: 1. Unremarkable left retrograde pyelogram. 2. Unremarkable left ureter and left kidney with no evidence of stone. 3. Very small stone in urinary bladder consistent with likely prior left     ureteral stone.  INDICATION:  Rita Diaz is a pleasant 57 year old lady with first episode of left renal colic.  She was seen in the emergency room on February 07, 2013, where she had CT scan, which corroborated small left distal ureteral stone.  She was seen in the office on February 08, 2013, and given the favorable characteristics, she was given the initial trial of medical expulsive therapy.  She however developed refractory symptoms on the evening of February 08, 2013, and we spoke by phone multiple times, and she had excruciating and refractory left flank pain as well as nausea with emesis that was refractory.  She presented to the emergency room with admission and plan for ureteroscopic stone manipulation.  Informed consent was obtained and placed in medical record.  PROCEDURE IN DETAIL:  The patient being Rita Diaz, procedure being left ureteroscopy was confirmed, patient was confirmed.  Procedure was carried out.  Time-out was performed.   Intravenous antibiotics were administered.  General LMA anesthesia was induced.  The patient was placed into a low lithotomy position.  Sterile field was created by prepping and draping the patient's vagina, introitus, and proximal thighs using iodine x3.  Next, cystourethroscopy was performed using a 22-French rigid cystoscope with 12-degree offset lens.  Inspection of the urinary bladder revealed no diverticula, papular lesions.  There was a very small dark calcification noted that possibly represented the previous left ureteral stone.  This was irrigated and set aside for compositional analysis.  As the goal of procedure today was to verify stone free status, we decided to proceed with left retrograde pyelogram and left ureteroscopy, as such left ureteral orifice was cannulated with a 6-French catheter and left retrograde pyelogram was obtained.  Left ventriculogram demonstrated single left ureter, single system left kidney.  No filling defects or narrowing noted.  A 0.038 Glidewire was advanced at the level of the upper pole and set aside as a safety wire. Next, semi-rigid ureteroscopy was performed for the entire length of left ureter alongside a separate Sensor working wire and an 8-French feeding tube in urinary bladder for pressure release.  This revealed no mucosal abnormalities or calcifications in the left ureter.  There was some mild edema in the very distal most ureter consistent  with probable side of previous stone impaction.  The semi-rigid ureteroscope was then exchanged for the 6-French flexible ureteroscope, which was placed over the sensor working wire to the level of the upper pole.  Next, systematic inspection of the left kidney was performed, each calix was individually inspected x2, and no mucosal abnormalities or calcifications were encountered.  The entire length of left ureter was once again reinspected upon withdrawing of the scope and no mucosal abnormalities  or calcifications were noted.  When this corroborated, the previous left ureteral stone had been in fact removed and the patient was stone free.  As such, a new 6 x 24 double-J stent was placed over the remaining safety wire.  Good proximal and distal curl were noted. Tether was left in place and fashioned to the left thigh.  Bladder was emptied per cystoscope.  Procedure was then terminated.  The patient tolerated the procedure well.  There no immediate periprocedural complications.  The patient was taken to the postanesthesia care unit in a stable condition.          ______________________________ Sebastian Ache, MD     TM/MEDQ  D:  02/09/2013  T:  02/09/2013  Job:  [id number]

## 2013-02-09 NOTE — Progress Notes (Signed)
Received patient post surgery, alert and oriented, no complaints of any pain or discomfort.with written order to d/c home after ambulating and patient voids.

## 2013-02-09 NOTE — Anesthesia Postprocedure Evaluation (Signed)
  Anesthesia Post-op Note  Patient: Rita Diaz  Procedure(s) Performed: Procedure(s) (LRB): CYSTOSCOPY WITH RETROGRADE PYELOGRAM, DIAGNOSTIC URETEROSCOPY AND STENT PLACEMENT  (Left)  Patient Location: PACU  Anesthesia Type: General  Level of Consciousness: awake and alert   Airway and Oxygen Therapy: Patient Spontanous Breathing  Post-op Pain: mild  Post-op Assessment: Post-op Vital signs reviewed, Patient's Cardiovascular Status Stable, Respiratory Function Stable, Patent Airway and No signs of Nausea or vomiting  Last Vitals:  Filed Vitals:   02/09/13 1015  BP: 128/69  Pulse:   Temp: 36.8 C  Resp: 16    Post-op Vital Signs: stable   Complications: No apparent anesthesia complications

## 2013-02-09 NOTE — ED Notes (Signed)
PA at bedside.

## 2013-02-09 NOTE — Transfer of Care (Signed)
Immediate Anesthesia Transfer of Care Note  Patient: Rita Diaz  Procedure(s) Performed: Procedure(s): CYSTOSCOPY WITH RETROGRADE PYELOGRAM, DIAGNOSTIC URETEROSCOPY AND STENT PLACEMENT  (Left)  Patient Location: PACU  Anesthesia Type:General  Level of Consciousness: awake, alert , oriented, sedated and patient cooperative  Airway & Oxygen Therapy: Patient Spontanous Breathing and Patient connected to face mask oxygen  Post-op Assessment: Report given to PACU RN and Post -op Vital signs reviewed and stable  Post vital signs: stable  Complications: No apparent anesthesia complications

## 2013-02-09 NOTE — ED Notes (Signed)
Spoke with on-call urologist (Dr. Berneice Heinrich) who states that patient is to be seen by EDP for pain control from kidney stone. States that patient may need possible admission to the hospital.

## 2013-02-09 NOTE — Progress Notes (Signed)
Patient discharge to home, husband at bedside. Discharge instructions and follow up appoint ment done and was given to the patient. PIV removed no s/s of infiltration or swelling noted on insertion site.

## 2013-02-09 NOTE — H&P (Signed)
Rita Diaz is an 57 y.o. female.    Chief Complaint: Left Ureteral Stone, Refractory pain + nausea + emesis  HPI:   1 - Left Ureteral Stone - Pt with left 4mm UVJ stone by ER CT 02/07/13 on w/u colicky flank pain. Given initial trial of medical therapy but then developed refractory pain with nausea and emesis 7/25 prompting ER visit again. UA without infectious parameters. No additional stones. No fevers / leukocytosis.  Today Rita Diaz is seen for above. She was admitted through ER.  Past Medical History  Diagnosis Date  . Kidney stone     passed one  . GERD (gastroesophageal reflux disease)     occ  . H/O hiatal hernia     Past Surgical History  Procedure Laterality Date  . Abdominal hysterectomy  1989  . Abdominal hernia repair  1996, 2008  . Laparoscopy      x3  . Cesarean section  1985  . Bladder suspension    . Cholecystectomy    . Hernia repair      x 2  . Ventral hernia repair  09/19/2012    Dr Corliss Skains  . Laparoscopic lysis of adhesions N/A 09/19/2012    Procedure: LAPAROSCOPIC LYSIS OF ADHESIONS;  Surgeon: Wilmon Arms. Corliss Skains, MD;  Location: [facility] OR;  Service: General;  Laterality: N/A;    No family history on file. Social History:  reports that she has never smoked. She has never used smokeless tobacco. She reports that she does not drink alcohol or use illicit drugs.  Allergies:  Allergies  Allergen Reactions  . Aspirin Other (See Comments)    Makes her burn all over.    Medications Prior to Admission  Medication Sig Dispense Refill  . ondansetron (ZOFRAN) 4 MG tablet Take 4 mg by mouth every 8 (eight) hours as needed for nausea.      Marland Kitchen oxyCODONE-acetaminophen (PERCOCET/ROXICET) 5-325 MG per tablet Take 1 tablet by mouth every 4 (four) hours as needed for pain.      . tamsulosin (FLOMAX) 0.4 MG CAPS Take 1 capsule (0.4 mg total) by mouth daily.  30 capsule  0  . neomycin-colistin-hydrocortisone-thonzonium (CORTISPORIN-TC) 3.09-17-08-0.5 MG/ML otic suspension Place 3  drops into the right ear 4 (four) times daily.  10 mL  0    Results for orders placed during the hospital encounter of 02/09/13 (from the past 48 hour(s))  URINALYSIS, ROUTINE W REFLEX MICROSCOPIC     Status: Abnormal   Collection Time    02/09/13  3:27 AM      Result Value Range   Color, Urine YELLOW  YELLOW   APPearance CLEAR  CLEAR   Specific Gravity, Urine 1.015  1.005 - 1.030   pH 6.0  5.0 - 8.0   Glucose, UA NEGATIVE  NEGATIVE mg/dL   Hgb urine dipstick MODERATE (*) NEGATIVE   Bilirubin Urine NEGATIVE  NEGATIVE   Ketones, ur NEGATIVE  NEGATIVE mg/dL   Protein, ur NEGATIVE  NEGATIVE mg/dL   Urobilinogen, UA 0.2  0.0 - 1.0 mg/dL   Nitrite NEGATIVE  NEGATIVE   Leukocytes, UA TRACE (*) NEGATIVE  URINE MICROSCOPIC-ADD ON     Status: None   Collection Time    02/09/13  3:27 AM      Result Value Range   Squamous Epithelial / LPF RARE  RARE   WBC, UA 0-2  <3 WBC/hpf   RBC / HPF 3-6  <3 RBC/hpf   Bacteria, UA RARE  RARE  POCT PREGNANCY, URINE  Status: None   Collection Time    02/09/13  3:49 AM      Result Value Range   Preg Test, Ur NEGATIVE  NEGATIVE   Comment:            THE SENSITIVITY OF THIS     METHODOLOGY IS >24 mIU/mL  POCT I-STAT, CHEM 8     Status: Abnormal   Collection Time    02/09/13  3:50 AM      Result Value Range   Sodium 140  135 - 145 mEq/L   Potassium 3.8  3.5 - 5.1 mEq/L   Chloride 105  96 - 112 mEq/L   BUN 11  6 - 23 mg/dL   Creatinine, Ser 7.82  0.50 - 1.10 mg/dL   Glucose, Bld 956 (*) 70 - 99 mg/dL   Calcium, Ion 2.13 (*) 1.12 - 1.23 mmol/L   TCO2 26  0 - 100 mmol/L   Hemoglobin 12.9  12.0 - 15.0 g/dL   HCT 08.6  57.8 - 46.9 %   No results found.  Review of Systems  Constitutional: Negative.  Negative for fever and chills.  HENT: Negative.   Eyes: Negative.   Respiratory: Negative.   Cardiovascular: Negative.   Gastrointestinal: Positive for nausea and vomiting.  Genitourinary: Positive for flank pain.  Musculoskeletal: Negative.    Skin: Negative.   Neurological: Negative.   Endo/Heme/Allergies: Negative.   Psychiatric/Behavioral: Negative.     Blood pressure 126/58, pulse 60, temperature 98.1 F (36.7 C), temperature source Oral, resp. rate 17, height 5' 4.5" (1.638 m), weight 85.9 kg (189 lb 6 oz), SpO2 95.00%. Physical Exam  Constitutional: She is oriented to person, place, and time. She appears well-developed and well-nourished.  Family at bedside  HENT:  Head: Normocephalic and atraumatic.  Eyes: EOM are normal. Pupils are equal, round, and reactive to light.  Neck: Normal range of motion. Neck supple.  Cardiovascular: Normal rate and regular rhythm.   Respiratory: Effort normal and breath sounds normal.  GI: Soft. Bowel sounds are normal.  Genitourinary:  Moderate left CVAT  Musculoskeletal: Normal range of motion.  Neurological: She is alert and oriented to person, place, and time.  Skin: Skin is warm and dry.  Psychiatric: She has a normal mood and affect. Her behavior is normal. Judgment and thought content normal.     Assessment/Plan  1 - Left Ureteral Stone - We again discussed options of medical therapy, shockwave lithotripsy, or ureteroscopic stone manipulation in order of increasing success but also risk. Pt has opted for ureteroscopy today. This is reasonable. Risks including non-cure, bleeding, infection damage or loss of kidney as well as DVT,PE,MI,CVA,Mortality discussed.   Rita Diaz 02/09/2013, 7:33 AM

## 2013-02-09 NOTE — ED Provider Notes (Signed)
CSN: [id number]     Arrival date & time 02/09/13  0245 History     First MD Initiated Contact with Patient 02/09/13 0310     Chief Complaint  Patient presents with  . Flank Pain   (Consider location/radiation/quality/duration/timing/severity/associated sxs/prior Treatment) HPI Comments: Patient diagnosed with 4mm L kidney stone at [facility] on Thursday via CT scan. Patient has hx of kidney stones x 3 as well as hx of L ureteral stent placement; Urologist - Dr. Berneice Heinrich. Admits to associated hematuria; denies fevers, chills, and diaphoresis. Percocet and Flomax providing no relief.  Patient is a 57 y.o. female presenting with flank pain. The history is provided by the patient. No language interpreter was used.  Flank Pain This is a new problem. Episode onset: 2 days. The problem occurs constantly. The problem has been gradually worsening. Associated symptoms include nausea and vomiting (NB/NB; last episode 3 hours ago). Pertinent negatives include no chills, diaphoresis or fever. Nothing aggravates the symptoms. Treatments tried: Percocet and Flomax. The treatment provided mild relief.    Past Medical History  Diagnosis Date  . Kidney stone     passed one  . GERD (gastroesophageal reflux disease)     occ  . H/O hiatal hernia    Past Surgical History  Procedure Laterality Date  . Abdominal hysterectomy  1989  . Abdominal hernia repair  1996, 2008  . Laparoscopy      x3  . Cesarean section  1985  . Bladder suspension    . Cholecystectomy    . Hernia repair      x 2  . Ventral hernia repair  09/19/2012    Dr Corliss Skains  . Laparoscopic lysis of adhesions N/A 09/19/2012    Procedure: LAPAROSCOPIC LYSIS OF ADHESIONS;  Surgeon: Wilmon Arms. Corliss Skains, MD;  Location: [facility] OR;  Service: General;  Laterality: N/A;   No family history on file. History  Substance Use Topics  . Smoking status: Never Smoker   . Smokeless tobacco: Never Used  . Alcohol Use: No   OB History   Grav Para Term Preterm  Abortions TAB SAB Ect Mult Living                 Review of Systems  Constitutional: Negative for fever, chills and diaphoresis.  Gastrointestinal: Positive for nausea and vomiting (NB/NB; last episode 3 hours ago).  Genitourinary: Positive for hematuria and flank pain.  All other systems reviewed and are negative.   Allergies  Aspirin  Home Medications   Current Outpatient Rx  Name  Route  Sig  Dispense  Refill  . ondansetron (ZOFRAN) 4 MG tablet   Oral   Take 4 mg by mouth every 8 (eight) hours as needed for nausea.         Marland Kitchen oxyCODONE-acetaminophen (PERCOCET/ROXICET) 5-325 MG per tablet   Oral   Take 1 tablet by mouth every 4 (four) hours as needed for pain.         . tamsulosin (FLOMAX) 0.4 MG CAPS   Oral   Take 1 capsule (0.4 mg total) by mouth daily.   30 capsule   0   . neomycin-colistin-hydrocortisone-thonzonium (CORTISPORIN-TC) 3.09-17-08-0.5 MG/ML otic suspension   Right Ear   Place 3 drops into the right ear 4 (four) times daily.   10 mL   0    BP 127/64  Pulse 76  Temp(Src) 98.9 F (37.2 C) (Oral)  Resp 22  Ht 5' 4.5" (1.638 m)  Wt 190 lb 4 oz (86.297  kg)  BMI 32.16 kg/m2  SpO2 95%  Physical Exam  Nursing note and vitals reviewed. Constitutional: She is oriented to person, place, and time. She appears well-developed and well-nourished. No distress.  Patient laying comfortably in exam room bed; in no visible or audible discomfort.  HENT:  Head: Normocephalic and atraumatic.  Eyes: Conjunctivae and EOM are normal. No scleral icterus.  Neck: Normal range of motion.  Cardiovascular: Normal rate, regular rhythm and intact distal pulses.   Pulmonary/Chest: Effort normal. No respiratory distress.  Abdominal: Soft. She exhibits no mass. There is tenderness (L CVA and L inguinal). There is no rebound and no guarding.  No peritoneal signs  Musculoskeletal: Normal range of motion.  Neurological: She is alert and oriented to person, place, and time.   Skin: Skin is warm and dry. No rash noted. She is not diaphoretic. No erythema. No pallor.  Psychiatric: She has a normal mood and affect. Her behavior is normal.   ED Course   Procedures (including critical care time)  Labs Reviewed  POCT I-STAT, CHEM 8 - Abnormal; Notable for the following:    Glucose, Bld 137 (*)    Calcium, Ion 1.06 (*)    All other components within normal limits  URINALYSIS, ROUTINE W REFLEX MICROSCOPIC  POCT PREGNANCY, URINE   No results found.  1. Kidney stone    MDM  Temp admit orders placed. IV toradol, dilaudid, and zofran ordered for pain control as well as IVF. Chem 8 and UA ordered. Patient well and nontoxic appearing; hemodynamically stable. Will consult Dr. Berneice Heinrich, patient's urologist, to discuss management plan.  Dr. Berneice Heinrich to admit for pain control and stone removal in AM. Will see patient in ED before brought to floor.    Antony Madura, PA-C 02/09/13 [phone]

## 2013-02-09 NOTE — ED Notes (Signed)
PT WAS ASSIGNED A BED@[id number] 

## 2013-02-09 NOTE — Anesthesia Preprocedure Evaluation (Signed)
Anesthesia Evaluation  Patient identified by MRN, date of birth, ID band Patient awake    Reviewed: Allergy & Precautions, H&P , NPO status , Patient's Chart, lab work & pertinent test results  Airway Mallampati: II TM Distance: >3 FB Neck ROM: full    Dental no notable dental hx. (+) Teeth Intact and Dental Advisory Given   Pulmonary neg pulmonary ROS,  breath sounds clear to auscultation  Pulmonary exam normal       Cardiovascular Exercise Tolerance: Good negative cardio ROS  Rhythm:regular Rate:Normal     Neuro/Psych negative neurological ROS  negative psych ROS   GI/Hepatic negative GI ROS, Neg liver ROS, GERD-  Controlled,  Endo/Other  negative endocrine ROS  Renal/GU negative Renal ROS  negative genitourinary   Musculoskeletal   Abdominal   Peds  Hematology negative hematology ROS (+)   Anesthesia Other Findings   Reproductive/Obstetrics negative OB ROS                           Anesthesia Physical Anesthesia Plan  ASA: I  Anesthesia Plan: General   Post-op Pain Management:    Induction: Intravenous  Airway Management Planned: LMA  Additional Equipment:   Intra-op Plan:   Post-operative Plan:   Informed Consent: I have reviewed the patients History and Physical, chart, labs and discussed the procedure including the risks, benefits and alternatives for the proposed anesthesia with the patient or authorized representative who has indicated his/her understanding and acceptance.   Dental Advisory Given  Plan Discussed with: CRNA and Surgeon  Anesthesia Plan Comments:         Anesthesia Quick Evaluation

## 2013-02-09 NOTE — ED Provider Notes (Signed)
Medical screening examination/treatment/procedure(s) were performed by non-physician practitioner and as supervising physician I was immediately available for consultation/collaboration.  Kourtlyn Charlet M Juliano Mceachin, MD 02/09/13 0726 

## 2013-02-09 NOTE — Brief Op Note (Signed)
02/09/2013  9:17 AM  PATIENT:  Rita Diaz  57 y.o. female  PRE-OPERATIVE DIAGNOSIS:  Left uretral stone  POST-OPERATIVE DIAGNOSIS:  Left uretral stone  PROCEDURE:  Procedure(s): CYSTOSCOPY WITH RETROGRADE PYELOGRAM, DIAGNOSTIC URETEROSCOPY AND STENT PLACEMENT  (Left)  SURGEON:  Surgeon(s) and Role:    * Sebastian Ache, MD - Primary  PHYSICIAN ASSISTANT:   ASSISTANTS: none   ANESTHESIA:   general  EBL:  Total I/O In: 0  Out: 5 [Blood:5]  BLOOD ADMINISTERED:none  DRAINS: none   LOCAL MEDICATIONS USED:  NONE  SPECIMEN:  Source of Specimen:  Left Ureteral Stone  DISPOSITION OF SPECIMEN:  [facility] for compositional analysis  COUNTS:  YES  TOURNIQUET:  * No tourniquets in log *  DICTATION: .Other Dictation: Dictation Number [id number]  PLAN OF CARE: Discharge to home after PACU  PATIENT DISPOSITION:  PACU - hemodynamically stable.   Delay start of Pharmacological VTE agent (>24hrs) due to surgical blood loss or risk of bleeding: not applicable

## 2013-02-11 ENCOUNTER — Encounter (HOSPITAL_COMMUNITY): Payer: Self-pay | Admitting: Urology

## 2013-02-28 ENCOUNTER — Other Ambulatory Visit: Payer: Self-pay | Admitting: Family Medicine

## 2013-03-04 NOTE — Telephone Encounter (Signed)
Last seen 02/08/13   B [place]    Can't find this med as being prescribed anywhere in encounters  Not on med list

## 2013-03-04 NOTE — Telephone Encounter (Signed)
Bill please address 

## 2013-03-07 NOTE — Telephone Encounter (Signed)
[facility] notified of refusal on med and requested [facility] to have pt contac office regarding this medication.

## 2013-03-07 NOTE — Telephone Encounter (Signed)
This is rx'd for men for bph

## 2013-04-01 NOTE — Discharge Summary (Signed)
Physician Discharge Summary  Patient ID: Rita Diaz MRN: [id number] DOB/AGE: 07-25-1955 57 y.o.  Admit date: 02/09/2013 Discharge date: 02/09/2013  Admission Diagnoses: Left Ureteral Stone with Refractory colic  Discharge Diagnoses: Left Ureteral Stone s/p treatment  Discharged Condition: good  Hospital Course:   Pt admitted through ER 7/26 for refractory left renal colic. Underwent urgent left ureteroscopy with stent which revealed the stone in question in the urinary bladder. She was discharged same day.  Consults: None  Significant Diagnostic Studies: Intraoperative Retrograde pyelogram  Treatments: surgery: as per above  Discharge Exam: Blood pressure 128/69, pulse 70, temperature 98.2 F (36.8 C), temperature source Oral, resp. rate 16, height 5' 4.5" (1.638 m), weight 85.9 kg (189 lb 6 oz), SpO2 94.00%. General appearance: alert, cooperative and appears stated age Head: Normocephalic, without obvious abnormality, atraumatic Eyes: conjunctivae/corneas clear. PERRL, EOM's intact. Fundi benign. Ears: normal TM's and external ear canals both ears Nose: Nares normal. Septum midline. Mucosa normal. No drainage or sinus tenderness. Throat: lips, mucosa, and tongue normal; teeth and gums normal Neck: no adenopathy, no carotid bruit, no JVD, supple, symmetrical, trachea midline and thyroid not enlarged, symmetric, no tenderness/mass/nodules Back: symmetric, no curvature. ROM normal. No CVA tenderness. Resp: clear to auscultation bilaterally Chest wall: no tenderness Cardio: regular rate and rhythm, S1, S2 normal, no murmur, click, rub or gallop GI: soft, non-tender; bowel sounds normal; no masses,  no organomegaly Extremities: extremities normal, atraumatic, no cyanosis or edema Pulses: 2+ and symmetric Skin: Skin color, texture, turgor normal. No rashes or lesions Lymph nodes: Cervical, supraclavicular, and axillary nodes normal. Neurologic: Grossly normal  Disposition:  01-Home or Self Care     Medication List    STOP taking these medications       tamsulosin 0.4 MG Caps capsule  Commonly known as:  FLOMAX      TAKE these medications       neomycin-colistin-hydrocortisone-thonzonium 3.09-17-08-0.5 MG/ML otic suspension  Commonly known as:  CORTISPORIN-TC  Place 3 drops into the right ear 4 (four) times daily.     oxyCODONE-acetaminophen 5-325 MG per tablet  Commonly known as:  PERCOCET/ROXICET  Take 1 tablet by mouth every 4 (four) hours as needed for pain.     sulfamethoxazole-trimethoprim 800-160 MG per tablet  Commonly known as:  BACTRIM DS  Take 1 tablet by mouth daily. X 4 days to prevent post-op infection     ZOFRAN 4 MG tablet  Generic drug:  ondansetron  Take 4 mg by mouth every 8 (eight) hours as needed for nausea.           Follow-up Information   Follow up with Sebastian Ache, MD. (We will arrange for MD visit in about 2-3 weeks.)    Specialty:  Urology   Contact information:   [address] [place] [address] [phone]       Signed: Sebastian Ache 04/01/2013, 5:21 PM

## 2013-04-11 ENCOUNTER — Ambulatory Visit (INDEPENDENT_AMBULATORY_CARE_PROVIDER_SITE_OTHER): Payer: BC Managed Care – PPO | Admitting: Nurse Practitioner

## 2013-04-11 ENCOUNTER — Telehealth: Payer: Self-pay | Admitting: Family Medicine

## 2013-04-11 VITALS — BP 145/90 | HR 80 | Temp 97.7°F | Ht 64.0 in | Wt 189.0 lb

## 2013-04-11 DIAGNOSIS — J029 Acute pharyngitis, unspecified: Secondary | ICD-10-CM

## 2013-04-11 LAB — POCT RAPID STREP A (OFFICE): Rapid Strep A Screen: NEGATIVE

## 2013-04-11 NOTE — Patient Instructions (Signed)
Viral Pharyngitis Viral pharyngitis is a viral infection that produces redness, pain, and swelling (inflammation) of the throat. It can spread from person to person (contagious). CAUSES Viral pharyngitis is caused by inhaling a large amount of certain germs called viruses. Many different viruses cause viral pharyngitis. SYMPTOMS Symptoms of viral pharyngitis include:  Sore throat.  Tiredness.  Stuffy nose.  Low-grade fever.  Congestion.  Cough. TREATMENT Treatment includes rest, drinking plenty of fluids, and the use of over-the-counter medication (approved by your caregiver). HOME CARE INSTRUCTIONS   Drink enough fluids to keep your urine clear or pale yellow.  Eat soft, cold foods such as ice cream, frozen ice pops, or gelatin dessert.  Gargle with warm salt water (1 tsp salt per 1 qt of water).  If over age 7, throat lozenges may be used safely.  Only take over-the-counter or prescription medicines for pain, discomfort, or fever as directed by your caregiver. Do not take aspirin. To help prevent spreading viral pharyngitis to others, avoid:  Mouth-to-mouth contact with others.  Sharing utensils for eating and drinking.  Coughing around others. SEEK MEDICAL CARE IF:   You are better in a few days, then become worse.  You have a fever or pain not helped by pain medicines.  There are any other changes that concern you. Document Released: 04/13/2005 Document Revised: 09/26/2011 Document Reviewed: 09/09/2010 Exit[employer] Patient Information 2014 [employer].  

## 2013-04-11 NOTE — Telephone Encounter (Signed)
OFFERED APPT FOR TONIGHT AT 5:15 BUT PT REFUSED. ADVISED TO COME TO OFFICE TO BE SEEN OR GO TO URGENT CARE.

## 2013-04-11 NOTE — Progress Notes (Signed)
  Subjective:    Patient ID: AMEILIA RATTAN, female    DOB: May 05, 1956, 57 y.o.   MRN: [id number]  HPI Patient in today c/o sore throat and congestion- started 2 days ago- granddaughter has also been sick but strep was negative.    Review of Systems  Constitutional: Negative for fever, chills and appetite change.  HENT: Positive for congestion, sore throat, rhinorrhea and trouble swallowing. Negative for ear pain and voice change.   Respiratory: Negative for cough.   Cardiovascular: Negative.   All other systems reviewed and are negative.       Objective:   Physical Exam  Nursing note and vitals reviewed. Constitutional: She appears well-developed and well-nourished.  HENT:  Right Ear: Hearing, tympanic membrane, external ear and ear canal normal.  Left Ear: Hearing, tympanic membrane, external ear and ear canal normal.  Nose: Mucosal edema and rhinorrhea present. Right sinus exhibits no maxillary sinus tenderness and no frontal sinus tenderness. Left sinus exhibits no maxillary sinus tenderness and no frontal sinus tenderness.  Mouth/Throat: Uvula is midline. Posterior oropharyngeal edema and posterior oropharyngeal erythema (mild) present.  Cardiovascular: Normal rate, regular rhythm and normal heart sounds.   Pulmonary/Chest: Effort normal and breath sounds normal.  Skin: Skin is warm.  Psychiatric: She has a normal mood and affect. Her behavior is normal. Judgment and thought content normal.   BP 145/90  Pulse 80  Temp(Src) 97.7 F (36.5 C) (Oral)  Ht 5\' 4"  (1.626 m)  Wt 189 lb (85.73 kg)  BMI 32.43 kg/m2         Assessment & Plan:  1. Acute pharyngitis - Rapid Strep A  2. Viral pharyngitis Force fluids Motrin or tylenol OTC OTC decongestant Throat lozenges if help New toothbrush in 3 days  Mary-Margaret Daphine Deutscher, FNP

## 2013-04-16 ENCOUNTER — Telehealth: Payer: Self-pay | Admitting: Nurse Practitioner

## 2013-04-16 NOTE — Telephone Encounter (Signed)
01/21/13 patient aware

## 2013-04-23 ENCOUNTER — Ambulatory Visit: Payer: BC Managed Care – PPO | Attending: Orthopedic Surgery

## 2013-04-23 DIAGNOSIS — M25619 Stiffness of unspecified shoulder, not elsewhere classified: Secondary | ICD-10-CM | POA: Insufficient documentation

## 2013-04-23 DIAGNOSIS — IMO0001 Reserved for inherently not codable concepts without codable children: Secondary | ICD-10-CM | POA: Insufficient documentation

## 2013-04-23 DIAGNOSIS — M25519 Pain in unspecified shoulder: Secondary | ICD-10-CM | POA: Insufficient documentation

## 2013-04-30 ENCOUNTER — Ambulatory Visit: Payer: BC Managed Care – PPO

## 2013-05-02 ENCOUNTER — Ambulatory Visit: Payer: BC Managed Care – PPO

## 2013-05-07 ENCOUNTER — Ambulatory Visit: Payer: BC Managed Care – PPO | Admitting: *Deleted

## 2013-05-07 ENCOUNTER — Telehealth: Payer: Self-pay | Admitting: Nurse Practitioner

## 2013-05-07 NOTE — Telephone Encounter (Signed)
NTBS.

## 2013-05-09 ENCOUNTER — Encounter: Payer: BC Managed Care – PPO | Admitting: *Deleted

## 2013-05-09 NOTE — Telephone Encounter (Signed)
LM ntbs told to call back if any qeustions

## 2013-05-14 ENCOUNTER — Encounter: Payer: BC Managed Care – PPO | Admitting: *Deleted

## 2013-05-16 ENCOUNTER — Encounter: Payer: BC Managed Care – PPO | Admitting: *Deleted

## 2013-05-24 ENCOUNTER — Ambulatory Visit (INDEPENDENT_AMBULATORY_CARE_PROVIDER_SITE_OTHER): Payer: BC Managed Care – PPO | Admitting: Surgery

## 2013-05-27 ENCOUNTER — Encounter (INDEPENDENT_AMBULATORY_CARE_PROVIDER_SITE_OTHER): Payer: Self-pay | Admitting: Surgery

## 2013-05-27 ENCOUNTER — Encounter (INDEPENDENT_AMBULATORY_CARE_PROVIDER_SITE_OTHER): Payer: Self-pay

## 2013-05-27 ENCOUNTER — Ambulatory Visit (INDEPENDENT_AMBULATORY_CARE_PROVIDER_SITE_OTHER): Payer: BC Managed Care – PPO | Admitting: Surgery

## 2013-05-27 VITALS — BP 144/90 | HR 76 | Temp 98.9°F | Resp 15 | Ht 64.5 in | Wt 191.6 lb

## 2013-05-27 DIAGNOSIS — K432 Incisional hernia without obstruction or gangrene: Secondary | ICD-10-CM

## 2013-05-27 LAB — BUN: BUN: 16 mg/dL (ref 6–23)

## 2013-05-27 NOTE — Progress Notes (Signed)
Patient ID: Rita Diaz, female   DOB: 08-01-1955, 57 y.o.   MRN: [id number]  Chief Complaint  Patient presents with  . Routine Post Op    LTFU reck hernia    HPI Rita Diaz is a 57 y.o. female.  HPI [name] who is status post open ventral hernia repair with mesh in 2010 by Dr. Ernestine Conrad Diaz. Previously she had had a suture repair of a primary ventral hernia by Dr. Kaleen Diaz in the 90s. She has also had a laparoscopic cholecystectomy in between the two hernia repairs. For the last couple of years she has had a lot of pain along the sides of her midline incision. It hurts to bend over or to turn. She denies any GI symptoms. She denies any constipation. According to her report, she was reevaluated by Dr. Ashley Diaz who stated that his only option was to remove the hernia mesh but that this would likely result in a recurrent hernia. In March 2014, We performed a diagnostic laparoscopy With lysis of adhesions.I did not see any sign of recurrent hernia. Her symptoms improved for a couple of months but she has had a recurrence of pain in her epigastrium along with some swelling below the area of pain. She does have some constipation but this is a chronic problem. She returns for evaluation.  Past Medical History  Diagnosis Date  . Kidney stone     passed one  . GERD (gastroesophageal reflux disease)     occ  . H/O hiatal hernia     Past Surgical History  Procedure Laterality Date  . Abdominal hysterectomy  1989  . Abdominal hernia repair  1996, 2008  . Laparoscopy      x3  . Cesarean section  1985  . Bladder suspension    . Cholecystectomy    . Hernia repair      x 2  . Ventral hernia repair  09/19/2012    Dr Rita Diaz  . Laparoscopic lysis of adhesions N/A 09/19/2012    Procedure: LAPAROSCOPIC LYSIS OF ADHESIONS;  Surgeon: Rita Arms. Jahaziel Francois, MD;  Location: [facility];  Service: General;  Laterality: N/A;  . Cystoscopy with retrograde pyelogram, ureteroscopy and stent  placement Left 02/09/2013    Procedure: CYSTOSCOPY WITH RETROGRADE PYELOGRAM, DIAGNOSTIC URETEROSCOPY AND STENT PLACEMENT ;  Surgeon: Rita Ache, MD;  Location: [facility];  Service: Urology;  Laterality: Left;    History reviewed. No pertinent family history.  Social History History  Substance Use Topics  . Smoking status: Never Smoker   . Smokeless tobacco: Never Used  . Alcohol Use: No    Allergies  Allergen Reactions  . Aspirin Other (See Comments)    Makes her burn all over.    No current outpatient prescriptions on file.   No current facility-administered medications for this visit.    Review of Systems Review of Systems  Constitutional: Negative for fever, chills and unexpected weight change.  HENT: Negative for congestion, hearing loss, sore throat, trouble swallowing and voice change.   Eyes: Negative for visual disturbance.  Respiratory: Negative for cough and wheezing.   Cardiovascular: Negative for chest pain, palpitations and leg swelling.  Gastrointestinal: Positive for abdominal pain, constipation and abdominal distention. Negative for nausea, vomiting, diarrhea, blood in stool and anal bleeding.  Genitourinary: Negative for hematuria, vaginal bleeding and difficulty urinating.  Musculoskeletal: Negative for arthralgias.  Skin: Negative for rash and wound.  Neurological: Negative for seizures, syncope and headaches.  Hematological: Negative for adenopathy. Does not bruise/bleed easily.  Psychiatric/Behavioral: Negative for confusion.    Blood pressure 144/90, pulse 76, temperature 98.9 F (37.2 C), temperature source Temporal, resp. rate 15, height 5' 4.5" (1.638 m), weight 191 lb 9.6 oz (86.909 kg).  Physical Exam Physical Exam WDWN in NAD HEENT:  EOMI, sclera anicteric Neck:  No masses, no thyromegaly Lungs:  CTA bilaterally; normal respiratory effort CV:  Regular rate and rhythm; no murmurs Abd:  +bowel sounds, soft, healed midline incision and  laparoscopic port sites Tender in epigastrium but this area does not show a bulge Below this area, there is a soft bulge, but this does not enlarge with Valsalva maneuver Ext:  Well-perfused; no edema Skin:  Warm, dry; no sign of jaundice  Data Reviewed none  Assessment    Possible recurrent ventral hernia - tender in midline with small bulge below this area     Plan    CT scan of the abdomen and pelvis to rule out recurrent ventral hernia.  Reevaluate in 2 weeks.        Teara Duerksen K. 05/27/2013, 11:46 AM

## 2013-05-28 ENCOUNTER — Telehealth (INDEPENDENT_AMBULATORY_CARE_PROVIDER_SITE_OTHER): Payer: Self-pay | Admitting: General Surgery

## 2013-05-28 ENCOUNTER — Ambulatory Visit
Admission: RE | Admit: 2013-05-28 | Discharge: 2013-05-28 | Disposition: A | Discharge status: Home / Self Care | Payer: BC Managed Care – PPO | Source: Ambulatory Visit | Attending: Surgery | Admitting: Surgery

## 2013-05-28 DIAGNOSIS — K432 Incisional hernia without obstruction or gangrene: Secondary | ICD-10-CM

## 2013-05-28 MED ORDER — IOHEXOL 300 MG/ML  SOLN
100.0000 mL | Freq: Once | INTRAMUSCULAR | Status: AC | PRN
  Administered 2013-05-28: 100 mL via INTRAVENOUS

## 2013-05-28 NOTE — Telephone Encounter (Signed)
Pt called back and made appt for 11/21 at 10:50.

## 2013-05-28 NOTE — Telephone Encounter (Signed)
LMOM for patient to call back up here and ask for Rita Diaz, so I can make her a follow up apt to see Dr Corliss Skains

## 2013-06-07 ENCOUNTER — Ambulatory Visit (INDEPENDENT_AMBULATORY_CARE_PROVIDER_SITE_OTHER): Payer: BC Managed Care – PPO | Admitting: Surgery

## 2013-06-07 ENCOUNTER — Encounter (INDEPENDENT_AMBULATORY_CARE_PROVIDER_SITE_OTHER): Payer: Self-pay | Admitting: Surgery

## 2013-06-07 VITALS — BP 128/76 | HR 68 | Temp 98.0°F | Resp 18 | Ht 64.0 in | Wt 190.8 lb

## 2013-06-07 DIAGNOSIS — R1013 Epigastric pain: Secondary | ICD-10-CM

## 2013-06-07 DIAGNOSIS — G8929 Other chronic pain: Secondary | ICD-10-CM

## 2013-06-07 DIAGNOSIS — Z8719 Personal history of other diseases of the digestive system: Secondary | ICD-10-CM

## 2013-06-07 DIAGNOSIS — Z9889 Other specified postprocedural states: Secondary | ICD-10-CM

## 2013-06-07 MED ORDER — METHOCARBAMOL 500 MG PO TABS: 500.0000 mg | ORAL_TABLET | Freq: Four times a day (QID) | ORAL | Status: DC

## 2013-06-07 NOTE — Progress Notes (Signed)
The patient is here for recheck of her abdomen. Her soreness has improved slightly since starting to wear the abdominal binder. Her CT scan showed no sign of recurrent hernia. She does have some constipation.  We spent some time discussing measures to help her with her chronic abdominal wall pain. She may use heat as well as her abdominal binder. She should use over-the-counter NSAID as well as a prescription for muscle relaxant. I encouraged her to try the muscle relaxant when she is home and has several hours to rest to see how the muscle relaxants affect her level of consciousness. She is relieved that she does not need to have more surgery. She will not try these measures to see if it improves. We will see her back as needed.  Wilmon Arms. Corliss Skains, MD, [facility]  General/ Trauma Surgery  06/07/2013 10:56 AM

## 2013-07-02 ENCOUNTER — Ambulatory Visit (INDEPENDENT_AMBULATORY_CARE_PROVIDER_SITE_OTHER): Payer: BC Managed Care – PPO | Admitting: Family Medicine

## 2013-07-02 VITALS — BP 137/86 | HR 83 | Temp 98.2°F | Resp 16 | Wt 179.0 lb

## 2013-07-02 DIAGNOSIS — N390 Urinary tract infection, site not specified: Secondary | ICD-10-CM

## 2013-07-02 LAB — POCT UA - MICROSCOPIC ONLY
Crystals, Ur, HPF, POC: NEGATIVE
RBC, urine, microscopic: NEGATIVE

## 2013-07-02 MED ORDER — CIPROFLOXACIN HCL 500 MG PO TABS: 500.0000 mg | ORAL_TABLET | Freq: Two times a day (BID) | ORAL | Status: DC

## 2013-07-02 MED ORDER — PHENAZOPYRIDINE HCL 200 MG PO TABS: 200.0000 mg | ORAL_TABLET | Freq: Three times a day (TID) | ORAL | Status: DC | PRN

## 2013-07-02 NOTE — Progress Notes (Signed)
   Subjective:    Patient ID: Rita Diaz, female    DOB: 1956-04-14, 57 y.o.   MRN: [id number]  HPI  This 57 y.o. female presents for evaluation of left abdomen discomfort and dysuria for the last few weeks.  Review of Systems C/o dysuria and urinary frequency. No chest pain, SOB, HA, dizziness, vision change, N/V, diarrhea, constipation, dysuria,  myalgias, arthralgias or rash.     Objective:   Physical Exam Vital signs noted  Well developed well nourished female.  HEENT - Head atraumatic Normocephalic                Eyes - PERRLA, Conjuctiva - clear Sclera- Clear EOMI                Ears - EAC's Wnl TM's Wnl Gross Hearing WNL                Nose - Nares patent                 Throat - oropharanx wnl Respiratory - Lungs CTA bilateral Cardiac - RRR S1 and S2 without murmur GI - Abdomen tender LLQ   Results for orders placed in visit on 07/02/13  POCT UA - MICROSCOPIC ONLY      Result Value Range   WBC, Ur, HPF, POC 15-20     RBC, urine, microscopic neg     Bacteria, U Microscopic neg     Mucus, UA neg     Epithelial cells, urine per micros occ     Crystals, Ur, HPF, POC neg     Casts, Ur, LPF, POC neg     Yeast, UA neg        Assessment & Plan:  Urinary tract infection, site not specified - Plan: POCT UA - Microscopic Only, POCT urinalysis dipstick, ciprofloxacin (CIPRO) 500 MG tablet, Urine culture, phenazopyridine (PYRIDIUM) 200 MG tablet Push po fluids, rest, tylenol and motrin otc prn as directed for fever, arthralgias, and myalgias.  Follow up prn if sx's continue or persist.  Deatra Canter FNP

## 2013-07-16 ENCOUNTER — Other Ambulatory Visit: Payer: Self-pay | Admitting: Family Medicine

## 2013-08-15 ENCOUNTER — Telehealth (INDEPENDENT_AMBULATORY_CARE_PROVIDER_SITE_OTHER): Payer: Self-pay

## 2013-08-15 ENCOUNTER — Other Ambulatory Visit (INDEPENDENT_AMBULATORY_CARE_PROVIDER_SITE_OTHER): Payer: Self-pay

## 2013-08-15 MED ORDER — METHOCARBAMOL 500 MG PO TABS: 500.0000 mg | ORAL_TABLET | Freq: Four times a day (QID) | ORAL | Status: DC

## 2013-08-15 NOTE — Telephone Encounter (Signed)
Refill for Methocarbamol Tabs 500mg  faxed to [facility].  Fax confirmation rec'd and attached to outgoing fax.

## 2013-08-16 ENCOUNTER — Telehealth: Payer: Self-pay | Admitting: *Deleted

## 2013-08-16 MED ORDER — SERTRALINE HCL 100 MG PO TABS: 100.0000 mg | ORAL_TABLET | Freq: Every day | ORAL | Status: DC

## 2013-08-16 NOTE — Telephone Encounter (Signed)
Patient requesting 90 day supply of sertraline hcl 50mg  tabs for mail order but do not see on current med list. Please advise

## 2013-08-16 NOTE — Telephone Encounter (Signed)
rx sent electronically. 

## 2013-08-22 ENCOUNTER — Ambulatory Visit (INDEPENDENT_AMBULATORY_CARE_PROVIDER_SITE_OTHER): Payer: BC Managed Care – PPO | Admitting: Family Medicine

## 2013-08-22 ENCOUNTER — Encounter: Payer: Self-pay | Admitting: Family Medicine

## 2013-08-22 VITALS — BP 155/77 | HR 74 | Temp 97.6°F | Ht 64.0 in | Wt 191.0 lb

## 2013-08-22 DIAGNOSIS — R509 Fever, unspecified: Secondary | ICD-10-CM

## 2013-08-22 DIAGNOSIS — N390 Urinary tract infection, site not specified: Secondary | ICD-10-CM

## 2013-08-22 LAB — POCT UA - MICROSCOPIC ONLY
Bacteria, U Microscopic: NEGATIVE
Casts, Ur, LPF, POC: NEGATIVE
Crystals, Ur, HPF, POC: NEGATIVE
Mucus, UA: NEGATIVE
RBC, urine, microscopic: NEGATIVE
Yeast, UA: NEGATIVE

## 2013-08-22 LAB — POCT URINALYSIS DIPSTICK
Bilirubin, UA: NEGATIVE
Blood, UA: NEGATIVE
Glucose, UA: NEGATIVE
Ketones, UA: NEGATIVE
Nitrite, UA: NEGATIVE
Protein, UA: NEGATIVE
Spec Grav, UA: 1.01
Urobilinogen, UA: NEGATIVE
pH, UA: 7.5

## 2013-08-22 LAB — POCT INFLUENZA A/B
Influenza A, POC: NEGATIVE
Influenza B, POC: NEGATIVE

## 2013-08-22 MED ORDER — CIPROFLOXACIN HCL 500 MG PO TABS: 500.0000 mg | ORAL_TABLET | Freq: Two times a day (BID) | ORAL | Status: DC

## 2013-08-22 NOTE — Progress Notes (Signed)
   Subjective:    Patient ID: Rita Diaz, female    DOB: 13-Sep-1955, 58 y.o.   MRN: [id number]  HPI This 58 y.o. female presents for evaluation of aches, pains, weakness, and fever. She has not felt well in a few days.   Review of Systems C/o fever, arthralgias and myalgias   No chest pain, SOB, HA, dizziness, vision change, N/V, diarrhea, constipation, dysuria, urinary urgency or frequency or rash.  Objective:   Physical Exam  Vital signs noted  Well developed well nourished female.  HEENT - Head atraumatic Normocephalic                Eyes - PERRLA, Conjuctiva - clear Sclera- Clear EOMI                Ears - EAC's Wnl TM's Wnl Gross Hearing WNL                Nose - Nares patent                 Throat - oropharanx wnl Respiratory - Lungs CTA bilateral Cardiac - RRR S1 and S2 without murmur GI - Abdomen soft Nontender and bowel sounds active x 4 Extremities - No edema. Neuro - Grossly intact.      Results for orders placed in visit on 08/22/13  POCT UA - MICROSCOPIC ONLY      Result Value Range   WBC, Ur, HPF, POC 10-15     RBC, urine, microscopic neg     Bacteria, U Microscopic neg     Mucus, UA neg     Epithelial cells, urine per micros occ     Crystals, Ur, HPF, POC neg     Casts, Ur, LPF, POC neg     Yeast, UA neg    POCT URINALYSIS DIPSTICK      Result Value Range   Color, UA yellow     Clarity, UA clear     Glucose, UA neg     Bilirubin, UA neg     Ketones, UA neg     Spec Grav, UA 1.010     Blood, UA neg     pH, UA 7.5     Protein, UA neg     Urobilinogen, UA negative     Nitrite, UA neg     Leukocytes, UA moderate (2+)    POCT INFLUENZA A/B      Result Value Range   Influenza A, POC Negative     Influenza B, POC Negative     Assessment & Plan:  Fever, unspecified - Plan: POCT UA - Microscopic Only, POCT urinalysis dipstick, POCT Influenza A/B, ciprofloxacin (CIPRO) 500 MG tablet, Urine culture  UTI (lower urinary tract infection) - Plan:  ciprofloxacin (CIPRO) 500 MG tablet, Urine culture po bid x 10 days Push po fluids, rest, tylenol and motrin otc prn as directed for fever, arthralgias, and myalgias.  Follow up prn if sx's continue or persist.  Deatra CanterWilliam J Oxford FNP

## 2013-08-23 LAB — URINE CULTURE

## 2013-09-02 ENCOUNTER — Other Ambulatory Visit (INDEPENDENT_AMBULATORY_CARE_PROVIDER_SITE_OTHER): Payer: BC Managed Care – PPO

## 2013-09-02 DIAGNOSIS — N390 Urinary tract infection, site not specified: Secondary | ICD-10-CM

## 2013-09-02 LAB — POCT URINALYSIS DIPSTICK
Bilirubin, UA: NEGATIVE
Glucose, UA: NEGATIVE
Ketones, UA: NEGATIVE
Leukocytes, UA: NEGATIVE
Nitrite, UA: NEGATIVE
Protein, UA: NEGATIVE
Spec Grav, UA: 1.01
Urobilinogen, UA: NEGATIVE
pH, UA: 7.5

## 2013-09-02 LAB — POCT UA - MICROSCOPIC ONLY
Casts, Ur, LPF, POC: NEGATIVE
Crystals, Ur, HPF, POC: NEGATIVE
Mucus, UA: NEGATIVE
Yeast, UA: NEGATIVE

## 2013-09-02 NOTE — Progress Notes (Signed)
Patient came in for labs only.

## 2013-10-07 ENCOUNTER — Ambulatory Visit (INDEPENDENT_AMBULATORY_CARE_PROVIDER_SITE_OTHER): Payer: BC Managed Care – PPO | Admitting: Family Medicine

## 2013-10-07 ENCOUNTER — Telehealth (INDEPENDENT_AMBULATORY_CARE_PROVIDER_SITE_OTHER): Payer: Self-pay | Admitting: *Deleted

## 2013-10-07 VITALS — BP 153/78 | HR 92 | Temp 97.8°F | Ht 64.0 in | Wt 189.2 lb

## 2013-10-07 DIAGNOSIS — K219 Gastro-esophageal reflux disease without esophagitis: Secondary | ICD-10-CM

## 2013-10-07 DIAGNOSIS — R609 Edema, unspecified: Secondary | ICD-10-CM

## 2013-10-07 MED ORDER — OMEPRAZOLE 40 MG PO CPDR: 40.0000 mg | DELAYED_RELEASE_CAPSULE | Freq: Every day | ORAL | Status: DC

## 2013-10-07 MED ORDER — FUROSEMIDE 20 MG PO TABS: ORAL_TABLET | ORAL | Status: DC

## 2013-10-07 NOTE — Telephone Encounter (Signed)
Patient called to report that she is having trouble with her stomach.  Patient reports that when she bends over then stands back up she doesn't feel like she can breath.  Patient states Dr. Corliss Skainssuei told her to call and let him know if she is having any problems.  Explained that I would send a message to Dr. Corliss Skainssuei then we will let her know his suggestion.  Patient states understanding and agreeable at this time.

## 2013-10-07 NOTE — Telephone Encounter (Signed)
I can reevaluate her in the office if she would like.

## 2013-10-07 NOTE — Progress Notes (Signed)
   Subjective:    Patient ID: Rita Diaz, female    DOB: Nov 30, 1955, 58 y.o.   MRN: [id number]  HPI This 58 y.o. female presents for evaluation of epigastric discomfort.  She has hx of hiatal hernia and  She has these sx's again. She has hx of surgery and she is due to follow up with her surgeon.  She is not on any PPI's.  She states she is swollen and her rings are tight in her hands.   Review of Systems C/o GERD sx's and edema. No chest pain, SOB, HA, dizziness, vision change, N/V, diarrhea, constipation, dysuria, urinary urgency or frequency, myalgias, arthralgias or rash.     Objective:   Physical Exam  Vital signs noted  Well developed well nourished female.  HEENT - Head atraumatic Normocephalic                Eyes - PERRLA, Conjuctiva - clear Sclera- Clear EOMI                Ears - EAC's Wnl TM's Wnl Gross Hearing WNL                Throat - oropharanx wnl Respiratory - Lungs CTA bilateral Cardiac - RRR S1 and S2 without murmur GI - Abdomen soft tender in epigastric region, and bowel sounds active x 4.       Assessment & Plan:  GERD (gastroesophageal reflux disease) - Plan: omeprazole (PRILOSEC) 40 MG capsule  Edema - Plan: furosemide (LASIX) 20 MG tablet po qd x 3 days prn swelling.    Deatra CanterWilliam J Yailin Biederman FNP

## 2013-10-11 ENCOUNTER — Ambulatory Visit (INDEPENDENT_AMBULATORY_CARE_PROVIDER_SITE_OTHER): Payer: BC Managed Care – PPO | Admitting: Surgery

## 2013-10-11 ENCOUNTER — Encounter (INDEPENDENT_AMBULATORY_CARE_PROVIDER_SITE_OTHER): Payer: Self-pay | Admitting: Surgery

## 2013-10-11 VITALS — BP 130/86 | HR 78 | Temp 99.4°F | Resp 16 | Ht 64.5 in | Wt 190.6 lb

## 2013-10-11 DIAGNOSIS — G8929 Other chronic pain: Secondary | ICD-10-CM

## 2013-10-11 DIAGNOSIS — R1013 Epigastric pain: Secondary | ICD-10-CM

## 2013-10-11 MED ORDER — CYCLOBENZAPRINE HCL 5 MG PO TABS: 5.0000 mg | ORAL_TABLET | Freq: Three times a day (TID) | ORAL | Status: DC | PRN

## 2013-10-11 NOTE — Progress Notes (Signed)
Status post Diagnostic laparoscopy in March of 2014 for chronic epigastric pain. She has old hernia mesh in place the we did not see any sign of recurrent hernia. The patient had been doing well since November. 4 days ago she was at work and was mopping the floor when she felt a sudden pain in her left upper quadrant near her costal margin. She denies any swelling in this area. His pain feels better when she is sitting up. She feels worse when she is supine.  No constipation or difficulty with bowel movements.  Her incision is well-healed with no sign of infection or hematoma. She has some mild laxity of the skin but there is no sign of recurrent hernia overlying her incision. The area in question is in her left upper quadrant just below the costal margin. No sign of hernia in this area when she is standing with Valsalva maneuver. There is mild tenderness to palpation.  It is likely that the patient twisted and strained on the sutures holding her mesh in place. This caused some inflammation of the abdominal wall. No sign of hernia. No need for surgical treatment. She should use a heating pad in the evening. I gave her prescription for some muscle relaxant to try to help does feel better. We'll recheck her in 3-4 weeks.  Rita ArmsMatthew K. Corliss Skainssuei, MD, [facility]  General/ Trauma Surgery  10/11/2013 9:56 AM

## 2013-11-05 ENCOUNTER — Encounter (INDEPENDENT_AMBULATORY_CARE_PROVIDER_SITE_OTHER): Payer: BC Managed Care – PPO | Admitting: Surgery

## 2013-11-08 ENCOUNTER — Encounter (INDEPENDENT_AMBULATORY_CARE_PROVIDER_SITE_OTHER): Payer: BC Managed Care – PPO | Admitting: Surgery

## 2013-11-11 ENCOUNTER — Ambulatory Visit (INDEPENDENT_AMBULATORY_CARE_PROVIDER_SITE_OTHER): Payer: BC Managed Care – PPO | Admitting: Surgery

## 2013-11-11 ENCOUNTER — Encounter (INDEPENDENT_AMBULATORY_CARE_PROVIDER_SITE_OTHER): Payer: Self-pay | Admitting: Surgery

## 2013-11-11 VITALS — BP 132/80 | HR 80 | Temp 97.6°F | Resp 14 | Wt 191.8 lb

## 2013-11-11 DIAGNOSIS — Z8719 Personal history of other diseases of the digestive system: Secondary | ICD-10-CM

## 2013-11-11 DIAGNOSIS — Z9889 Other specified postprocedural states: Secondary | ICD-10-CM

## 2013-11-11 NOTE — Progress Notes (Signed)
Followup of her abdominal pain. Her pain is significantly improved with the use of Flexeril. She still has occasional tenderness at the upper end of her incision just to the left of midline.  No sign of recurrent in this area. The rest of her abdomen is soft and nontender.  She should continue using anti-inflammatory medications as well as when necessary Flexeril until completely healed. Followup when necessary.  Wilmon ArmsMatthew K. Corliss Skainssuei, MD, [facility]  General/ Trauma Surgery  11/11/2013 1:19 PM

## 2013-12-03 ENCOUNTER — Telehealth: Payer: Self-pay | Admitting: Family Medicine

## 2013-12-04 ENCOUNTER — Other Ambulatory Visit: Payer: Self-pay | Admitting: Family Medicine

## 2013-12-04 DIAGNOSIS — R1013 Epigastric pain: Secondary | ICD-10-CM

## 2013-12-04 NOTE — Telephone Encounter (Signed)
Referral put in for GI consult.

## 2013-12-12 ENCOUNTER — Telehealth: Payer: Self-pay | Admitting: Family Medicine

## 2013-12-16 ENCOUNTER — Telehealth (INDEPENDENT_AMBULATORY_CARE_PROVIDER_SITE_OTHER): Payer: Self-pay | Admitting: *Deleted

## 2013-12-16 ENCOUNTER — Encounter (INDEPENDENT_AMBULATORY_CARE_PROVIDER_SITE_OTHER): Payer: Self-pay | Admitting: Surgery

## 2013-12-16 NOTE — Telephone Encounter (Signed)
Pt called in this morning, stating Dr. Corliss Skains advised her to go back to her GI dr and have the light ran down her again?  I didn' see any notes regarding this.  However, looks like her PCP has put in a referral for her to see her GI dr, but the pt states that she just wants to schedule the procedure to be done then go see him.  She advised that she didn't want to have to pay a co-pay to go see him and schedule it then go and have the procedure done then have to pay another co-pay to go back and get the results.  Please advise!  Victorino Dike

## 2013-12-16 NOTE — Telephone Encounter (Signed)
I called the pt back and explained to her Dr. Fatima Sanger message.  She said that she was advised by Dr. Greig Right (GI) office that if Dr. Corliss Skains called and let them know that he wants this test done, then they could just schedule it without seeing her . first.  The pt states that she just don't want to have to make 2 different trips to [place].  Please Francee Piccolo!  Victorino Dike

## 2013-12-16 NOTE — Telephone Encounter (Signed)
I don't have any control over the billing practices of the Gi doctor.  You can try to explain the global period to her, but I can't really help her with this.

## 2013-12-16 NOTE — Telephone Encounter (Signed)
I called patient back and told her that Dr Corliss Skains stated that we will not be the one of will do the referral that it will have to come from her PCP. I pulled two phone notes from [facility] and I asked the patient if she go to that Practice and she stated yes, and I read the two phones to her that one back on 12-03-2013 and 12-12-2013 and it stated that she needs for an endoscopy and she would like to go to Dr Eulah Pont in [place]. And it also stated that she wants to see Dr Eulah Pont because she has seen him before. She did not want an apt to see anyone down here in [place] not in the [place] group. I looked and I told her that she was not schedule with anyone in the [place] or in [place] group. I told again that she will have to get her PCP to referral to [facility] to see Dr Eulah Pont, she understood and she stated that she will call them, and I told her that they can see my phone note, that Dr Corliss Skains will not be doing the referral to Dr Eulah Pont in [facility]

## 2013-12-24 ENCOUNTER — Telehealth: Payer: Self-pay | Admitting: Family Medicine

## 2014-01-07 ENCOUNTER — Telehealth: Payer: Self-pay | Admitting: Family Medicine

## 2014-01-07 ENCOUNTER — Other Ambulatory Visit: Payer: Self-pay | Admitting: Family Medicine

## 2014-01-08 ENCOUNTER — Other Ambulatory Visit: Payer: Self-pay | Admitting: Family Medicine

## 2014-01-08 NOTE — Telephone Encounter (Signed)
Appointment made per [place]

## 2014-01-08 NOTE — Telephone Encounter (Signed)
Please follow up

## 2014-01-09 ENCOUNTER — Encounter: Payer: Self-pay | Admitting: Family Medicine

## 2014-01-09 ENCOUNTER — Ambulatory Visit (INDEPENDENT_AMBULATORY_CARE_PROVIDER_SITE_OTHER): Payer: BC Managed Care – PPO | Admitting: Family Medicine

## 2014-01-09 VITALS — BP 158/81 | HR 86 | Temp 97.6°F | Ht 64.5 in | Wt 186.6 lb

## 2014-01-09 DIAGNOSIS — F32A Depression, unspecified: Secondary | ICD-10-CM

## 2014-01-09 DIAGNOSIS — F3289 Other specified depressive episodes: Secondary | ICD-10-CM

## 2014-01-09 DIAGNOSIS — F329 Major depressive disorder, single episode, unspecified: Secondary | ICD-10-CM

## 2014-01-09 MED ORDER — ALPRAZOLAM 0.5 MG PO TABS: 0.5000 mg | ORAL_TABLET | Freq: Three times a day (TID) | ORAL | Status: DC | PRN

## 2014-01-09 NOTE — Progress Notes (Signed)
   Subjective:    Patient ID: Rita Diaz, female    DOB: [birth year], 58 y.o.   MRN: [id number]  HPI  This 58 y.o. female presents for evaluation of anxiety and depression.  She is having depression sx's and the zoloft is not helping. The sx's have been since her granddaughter died.  She has been mourninig.  Review of Systems No chest pain, SOB, HA, dizziness, vision change, N/V, diarrhea, constipation, dysuria, urinary urgency or frequency, myalgias, arthralgias or rash.     Objective:   Physical Exam  Vital signs noted  Well developed well nourished teary female.  HEENT - Head atraumatic Normocephalic                Eyes - PERRLA, Conjuctiva - clear Sclera- Clear EOMI                Ears - EAC's Wnl TM's Wnl Gross Hearing WNL                Nose - Nares patent                 Throat - oropharanx wnl Respiratory - Lungs CTA bilateral Cardiac - RRR S1 and S2 without murmur GI - Abdomen soft Nontender and bowel sounds active x 4       Assessment & Plan:  Depression Continue zoloft and add xanax 0.5mg  one po  Tid prn #30 w/3 rf Follow up in one months  Deatra CanterWilliam J Oxford FNP

## 2014-04-08 ENCOUNTER — Telehealth: Payer: Self-pay | Admitting: Family Medicine

## 2014-04-08 ENCOUNTER — Other Ambulatory Visit: Payer: Self-pay | Admitting: Family Medicine

## 2014-04-08 DIAGNOSIS — R1013 Epigastric pain: Principal | ICD-10-CM

## 2014-04-08 DIAGNOSIS — G8929 Other chronic pain: Secondary | ICD-10-CM

## 2014-04-08 NOTE — Telephone Encounter (Signed)
Tell the patient the reason I wanted to see her is that she was c/o abdominal pain and when I saw her last she did not have this and so this is new.  I have reviewed her chart and it appears she had some abdominal pain that was relieved by flexeril in March 2015.  So it appears she never did see GI when referred back in March 2015.  I sent her referral in And please tell her she is welcome to come in and be seen if she wants to until seen by GI.

## 2014-04-08 NOTE — Telephone Encounter (Signed)
Follow up.

## 2014-04-08 NOTE — Telephone Encounter (Signed)
Pt aware to make an appt here

## 2014-04-08 NOTE — Telephone Encounter (Signed)
Seen Rita Diaz last few times - will this be ok to do?

## 2014-04-09 NOTE — Telephone Encounter (Signed)
Left detailed message on voicemail.  

## 2014-04-16 ENCOUNTER — Ambulatory Visit (INDEPENDENT_AMBULATORY_CARE_PROVIDER_SITE_OTHER): Payer: BC Managed Care – PPO | Admitting: Family Medicine

## 2014-04-16 ENCOUNTER — Encounter: Payer: Self-pay | Admitting: Family Medicine

## 2014-04-16 VITALS — BP 126/76 | HR 80 | Temp 98.7°F | Ht 64.5 in | Wt 191.0 lb

## 2014-04-16 DIAGNOSIS — R609 Edema, unspecified: Secondary | ICD-10-CM

## 2014-04-16 MED ORDER — FUROSEMIDE 20 MG PO TABS: ORAL_TABLET | ORAL | Status: DC

## 2014-04-16 NOTE — Progress Notes (Signed)
   Subjective:    Patient ID: Rita HalimKathy T Suen, female    DOB: 16-Feb-1956, [age] y.o.   MRN: [id number]  HPI This 58 y.o. female presents for evaluation of lower extremity edema and edema in her hands.   Review of Systems C/o edema No chest pain, SOB, HA, dizziness, vision change, N/V, diarrhea, constipation, dysuria, urinary urgency or frequency, myalgias, arthralgias or rash.     Objective:   Physical Exam Vital signs noted  Well developed well nourished female.  HEENT - Head atraumatic Normocephalic Respiratory - Lungs CTA bilateral Cardiac - RRR S1 and S2 without murmur GI - Abdomen soft Nontender and bowel sounds active x 4 Extremities - Edema in pre tibial and pedal region.      Assessment & Plan:  Edema - Plan: furosemide (LASIX) 20 MG tablet po qd #30 w/ 5 rf Venous compression stockings  Deatra CanterWilliam J Deyonte Cadden FNP

## 2014-04-22 ENCOUNTER — Ambulatory Visit (INDEPENDENT_AMBULATORY_CARE_PROVIDER_SITE_OTHER): Payer: BC Managed Care – PPO

## 2014-04-22 ENCOUNTER — Encounter: Payer: Self-pay | Admitting: Family Medicine

## 2014-04-22 ENCOUNTER — Encounter: Payer: Self-pay | Admitting: *Deleted

## 2014-04-22 ENCOUNTER — Ambulatory Visit (INDEPENDENT_AMBULATORY_CARE_PROVIDER_SITE_OTHER): Payer: BC Managed Care – PPO | Admitting: Family Medicine

## 2014-04-22 VITALS — BP 120/74 | HR 83 | Temp 98.1°F | Ht 64.5 in | Wt 189.0 lb

## 2014-04-22 DIAGNOSIS — R0602 Shortness of breath: Secondary | ICD-10-CM

## 2014-04-22 DIAGNOSIS — R5383 Other fatigue: Secondary | ICD-10-CM

## 2014-04-22 DIAGNOSIS — R062 Wheezing: Secondary | ICD-10-CM

## 2014-04-22 DIAGNOSIS — R609 Edema, unspecified: Secondary | ICD-10-CM

## 2014-04-22 DIAGNOSIS — R5381 Other malaise: Secondary | ICD-10-CM

## 2014-04-22 LAB — POCT CBC
Granulocyte percent: 60.6 %G (ref 37–80)
HCT, POC: 42.2 % (ref 37.7–47.9)
HEMOGLOBIN: 13.9 g/dL (ref 12.2–16.2)
Lymph, poc: 1.7 (ref 0.6–3.4)
MCH, POC: 28.7 pg (ref 27–31.2)
MCHC: 33 g/dL (ref 31.8–35.4)
MCV: 86.9 fL (ref 80–97)
MPV: 8.5 fL (ref 0–99.8)
POC GRANULOCYTE: 2.8 (ref 2–6.9)
POC LYMPH PERCENT: 36.1 %L (ref 10–50)
Platelet Count, POC: 235 10*3/uL (ref 142–424)
RBC: 4.9 M/uL (ref 4.04–5.48)
RDW, POC: 12.1 %
WBC: 4.7 10*3/uL (ref 4.6–10.2)

## 2014-04-22 NOTE — Progress Notes (Signed)
Subjective:    Patient ID: Rita Diaz, female    DOB: [birth year], 58 y.o.   MRN: [id number]  HPI Patient here today for swelling of left arm and bilateral leg and feet swelling. This is has been going on for about 3-4 week, with it worsening the last 2 weeks.         Patient Active Problem List   Diagnosis Date Noted  . Recurrent ventral hernia 05/27/2013  . Kidney stone 12/06/2012  . S/P repair of recurrent ventral hernia 08/27/2012  . Abdominal pain, chronic, left upper quadrant 08/27/2012   Outpatient Encounter Prescriptions as of 04/22/2014  Medication Sig  . ALPRAZolam (XANAX) 0.5 MG tablet Take 1 tablet (0.5 mg total) by mouth 3 (three) times daily as needed for anxiety.  . furosemide (LASIX) 20 MG tablet One po qd 2 - 3 days prn swelling prn severe swelling    Review of Systems  Constitutional: Negative.   HENT: Negative.   Eyes: Negative.   Respiratory: Negative.   Cardiovascular: Positive for leg swelling (and feet  -- along with left arm).  Gastrointestinal: Negative.   Endocrine: Negative.   Genitourinary: Negative.   Musculoskeletal: Negative.   Skin: Negative.   Allergic/Immunologic: Negative.   Neurological: Positive for light-headedness.  Hematological: Negative.   Psychiatric/Behavioral: Negative.        Objective:   Physical Exam  Nursing note and vitals reviewed. Constitutional: She is oriented to person, place, and time. She appears well-developed and well-nourished. No distress.  HENT:  Head: Normocephalic and atraumatic.  Right Ear: External ear normal.  Left Ear: External ear normal.  Mouth/Throat: Oropharynx is clear and moist. No oropharyngeal exudate.  The patient looks like she has had a recent nosebleed from her right nasal septum  Eyes: Conjunctivae and EOM are normal. Pupils are equal, round, and reactive to light. Right eye exhibits no discharge. Left eye exhibits no discharge. No scleral icterus.  Neck: Normal range of motion.  Neck supple. No thyromegaly present.  There are no carotid bruits or thyroid enlargement  Cardiovascular: Normal rate, regular rhythm, normal heart sounds and intact distal pulses.  Exam reveals no gallop and no friction rub.   No murmur heard. The rhythm is regular at 72 per minute  Pulmonary/Chest: Effort normal and breath sounds normal. No respiratory distress. She has no wheezes. She has no rales. She exhibits no tenderness.  Abdominal: Soft. Bowel sounds are normal. She exhibits no mass. There is no tenderness. There is no rebound and no guarding.  There is a large midline scar secondary to previous hiatal hernia surgery  Musculoskeletal: Normal range of motion. She exhibits edema. She exhibits no tenderness.  There is minimal pretibial edema and arm edema  Lymphadenopathy:    She has no cervical adenopathy.  Neurological: She is alert and oriented to person, place, and time. She has normal reflexes. No cranial nerve deficit.  Skin: Skin is warm and dry. No rash noted.  Psychiatric: She has a normal mood and affect. Her behavior is normal. Judgment and thought content normal.   BP 120/74  Pulse 83  Temp(Src) 98.1 F (36.7 C) (Oral)  Ht 5' 4.5" (1.638 m)  Wt 189 lb (85.73 kg)  BMI 31.95 kg/m2  [facility] reading (PRIMARY) by  Dr. Brunilda Payor x-ray-- borderline cardiac enlargement  Assessment & Plan:  1. Edema - BMP8+EGFR - Hepatic function panel - POCT CBC - Thyroid Panel With TSH - DG Chest 2 View; Future - Ambulatory referral to Cardiology  2. Malaise and fatigue - Ambulatory referral to Cardiology  3. Shortness of breath - Ambulatory referral to Cardiology   Patient Instructions  Continue to watch sodium intake in her diet For now stay on current medication We will arrange for you to have an appointment with the cardiologist for special test called an echocardiogram. We will call you with the lab results and chest x-ray results  once they are available   Arrie Senate MD

## 2014-04-22 NOTE — Patient Instructions (Signed)
Continue to watch sodium intake in her diet For now stay on current medication We will arrange for you to have an appointment with the cardiologist for special test called an echocardiogram. We will call you with the lab results and chest x-ray results once they are available

## 2014-04-23 ENCOUNTER — Ambulatory Visit (INDEPENDENT_AMBULATORY_CARE_PROVIDER_SITE_OTHER): Payer: BC Managed Care – PPO | Admitting: Cardiology

## 2014-04-23 ENCOUNTER — Encounter: Payer: Self-pay | Admitting: Cardiology

## 2014-04-23 ENCOUNTER — Telehealth: Payer: Self-pay

## 2014-04-23 VITALS — BP 123/80 | HR 82 | Ht 64.5 in | Wt 180.0 lb

## 2014-04-23 DIAGNOSIS — R0602 Shortness of breath: Secondary | ICD-10-CM

## 2014-04-23 DIAGNOSIS — Z136 Encounter for screening for cardiovascular disorders: Secondary | ICD-10-CM

## 2014-04-23 LAB — THYROID PANEL WITH TSH
Free Thyroxine Index: 2.6 (ref 1.2–4.9)
T3 Uptake Ratio: 24 % (ref 24–39)
T4, Total: 10.7 ug/dL (ref 4.5–12.0)
TSH: 1.35 u[IU]/mL (ref 0.450–4.500)

## 2014-04-23 LAB — HEPATIC FUNCTION PANEL
ALK PHOS: 89 IU/L (ref 39–117)
ALT: 35 IU/L — AB (ref 0–32)
AST: 33 IU/L (ref 0–40)
Albumin: 4.3 g/dL (ref 3.5–5.5)
BILIRUBIN DIRECT: 0.17 mg/dL (ref 0.00–0.40)
Total Bilirubin: 0.6 mg/dL (ref 0.0–1.2)
Total Protein: 6.5 g/dL (ref 6.0–8.5)

## 2014-04-23 LAB — BMP8+EGFR
BUN / CREAT RATIO: 12 (ref 9–23)
BUN: 11 mg/dL (ref 6–24)
CALCIUM: 9.2 mg/dL (ref 8.7–10.2)
CO2: 26 mmol/L (ref 18–29)
CREATININE: 0.9 mg/dL (ref 0.57–1.00)
Chloride: 103 mmol/L (ref 97–108)
GFR calc non Af Amer: 71 mL/min/{1.73_m2} (ref >59)
GFR, EST AFRICAN AMERICAN: 82 mL/min/{1.73_m2} (ref >59)
Glucose: 135 mg/dL — ABNORMAL HIGH (ref 65–99)
Potassium: 4.2 mmol/L (ref 3.5–5.2)
SODIUM: 144 mmol/L (ref 134–144)

## 2014-04-23 NOTE — Progress Notes (Signed)
Clinical Summary Ms. Alona BeneJoyce is a 58 y.o.female seen today as a new patient for the following medical problems.   1. SOB - + edema started approx 3-4 weeks. Left arm, left leg. + DOE at block at 1 block that's new - has has some left shoulder pain, no chest pain. +orthopnea, no PND - TSH 1.35, Cr 0.9, Hgb 13.9 - started on lasix, with improvement in swelling. She reports 9 lbs weight loss with imporved breathing.      Past Medical History  Diagnosis Date  . Kidney stone     passed one  . GERD (gastroesophageal reflux disease)     occ  . H/O hiatal hernia      Allergies  Allergen Reactions  . Aspirin Other (See Comments)    Makes her burn all over.     Current Outpatient Prescriptions  Medication Sig Dispense Refill  . ALPRAZolam (XANAX) 0.5 MG tablet Take 1 tablet (0.5 mg total) by mouth 3 (three) times daily as needed for anxiety.  30 tablet  3  . furosemide (LASIX) 20 MG tablet One po qd 2 - 3 days prn swelling prn severe swelling  30 tablet  5   No current facility-administered medications for this visit.     Past Surgical History  Procedure Laterality Date  . Abdominal hysterectomy  1989  . Abdominal hernia repair  1996, 2008  . Laparoscopy      x3  . Cesarean section  1985  . Bladder suspension    . Cholecystectomy    . Hernia repair      x 2  . Ventral hernia repair  09/19/2012    Dr Corliss SkainsSUEI  . Laparoscopic lysis of adhesions N/A 09/19/2012    Procedure: LAPAROSCOPIC LYSIS OF ADHESIONS;  Surgeon: Wilmon ArmsMatthew K. Tsuei, MD;  Location: [facility] OR;  Service: General;  Laterality: N/A;  . Cystoscopy with retrograde pyelogram, ureteroscopy and stent placement Left 02/09/2013    Procedure: CYSTOSCOPY WITH RETROGRADE PYELOGRAM, DIAGNOSTIC URETEROSCOPY AND STENT PLACEMENT ;  Surgeon: Sebastian Acheheodore Manny, MD;  Location: [facility];  Service: Urology;  Laterality: Left;     Allergies  Allergen Reactions  . Aspirin Other (See Comments)    Makes her burn all over.      Family  History  Problem Relation Age of Onset  . Other Mother 2872    nash  . Cancer Father [id number]    lung Cancer     Social History Ms. Alona BeneJoyce reports that she has never smoked. She has never used smokeless tobacco. Ms. Alona BeneJoyce reports that she does not drink alcohol.   Review of Systems CONSTITUTIONAL: No weight loss, fever, chills, weakness or fatigue.  HEENT: Eyes: No visual loss, blurred vision, double vision or yellow sclerae.No hearing loss, sneezing, congestion, runny nose or sore throat.  SKIN: No rash or itching.  CARDIOVASCULAR: per HPI RESPIRATORY: No cough or sputum.  GASTROINTESTINAL: No anorexia, nausea, vomiting or diarrhea. No abdominal pain or blood.  GENITOURINARY: No burning on urination, no polyuria NEUROLOGICAL: No headache, dizziness, syncope, paralysis, ataxia, numbness or tingling in the extremities. No change in bowel or bladder control.  MUSCULOSKELETAL: No muscle, back pain, joint pain or stiffness.  LYMPHATICS: No enlarged nodes. No history of splenectomy.  PSYCHIATRIC: No history of depression or anxiety.  ENDOCRINOLOGIC: No reports of sweating, cold or heat intolerance. No polyuria or polydipsia.  Marland Kitchen.   Physical Examination p 82 bp 123/80 Wt 180 lbs BMI 30 Gen: resting comfortably, no acute  distress HE: RRR, no m/r/g, no JVD,no carotid bruits Resp: Clear to auscultation bilaterally GI: abdomen is soft, non-tender, non-distended, normal bowel sounds, no hepatosplenomegaly MSK: extremities are warm, trace bilateral edema Skin: warm, no rash Neuro:  no focal deficits Psych: appropriate affect   Diagnostic Studies 04/23/14 EKG NSR    Assessment and Plan  1. SOB - unclear etiology, her edema involving mainly her left arm and leg is fairly atypical for cardiac induced edema. Will check echo to evaluate for any potential cardiac dysfunction. Unclear if pattern of swelling could be explained by some form of lymphatic obstruction given that it is unilateral, defer  further workup to pcp, f/u echo for potential cardiac cause.    F/u pending echo results      Antoine Poche, M.D., F.A.C.C.

## 2014-04-23 NOTE — Telephone Encounter (Signed)
Message copied by Roselee CulverHUMLEY, Marvelle Span on Wed Apr 23, 2014  9:02 AM ------      Message from: Ernestina PennaMOORE, DONALD W      Created: Tue Apr 22, 2014  5:25 PM       As per radiology report ------

## 2014-04-23 NOTE — Patient Instructions (Signed)
There were no changes to your medications. Continue as directed. Your physician has requested that you have an echocardiogram. Echocardiography is a painless test that uses sound waves to create images of your heart. It provides your doctor with information about the size and shape of your heart and how well your heart's chambers and valves are working. This procedure takes approximately one hour. There are no restrictions for this procedure. Office will contact with results via phone or letter.   Follow up pending results of echo.

## 2014-04-23 NOTE — Telephone Encounter (Signed)
Pt aware of CXR results ans has an appointment with [facility] in [place] 04/23/14 at 10:40am

## 2014-04-24 ENCOUNTER — Telehealth: Payer: Self-pay | Admitting: Family Medicine

## 2014-04-24 NOTE — Telephone Encounter (Signed)
Aware. 

## 2014-04-24 NOTE — Telephone Encounter (Signed)
She can take 2 pills daily on Monday Wednesday and Friday and one on the other days

## 2014-04-24 NOTE — Telephone Encounter (Signed)
Patient says she would like to take another fluid pill.  Her weight has went up almost 9 pounds since Saturday.  Please advise.

## 2014-05-02 ENCOUNTER — Telehealth: Payer: Self-pay | Admitting: Family Medicine

## 2014-05-02 NOTE — Telephone Encounter (Signed)
Message copied by Doreatha MassedMOORE, MITZI on Fri May 02, 2014  3:24 PM ------      Message from: Ernestina PennaMOORE, DONALD W      Created: Wed Apr 23, 2014  8:28 AM       The blood sugar is elevated at 135. The creatinine the most important kidney function test is within normal limits. The electrolytes including potassium are within normal limits      One liver function test is slightly elevated but this is okay.      All thyroid function tests are within normal limits ------

## 2014-05-12 ENCOUNTER — Encounter: Payer: Self-pay | Admitting: Family Medicine

## 2014-05-12 NOTE — Telephone Encounter (Signed)
Patient aware.

## 2014-05-14 ENCOUNTER — Other Ambulatory Visit: Payer: BC Managed Care – PPO

## 2014-05-21 ENCOUNTER — Other Ambulatory Visit: Payer: Self-pay

## 2014-05-21 ENCOUNTER — Telehealth: Payer: Self-pay | Admitting: Family Medicine

## 2014-05-21 ENCOUNTER — Other Ambulatory Visit (INDEPENDENT_AMBULATORY_CARE_PROVIDER_SITE_OTHER): Payer: BC Managed Care – PPO

## 2014-05-21 DIAGNOSIS — R601 Generalized edema: Secondary | ICD-10-CM

## 2014-05-21 DIAGNOSIS — Z136 Encounter for screening for cardiovascular disorders: Secondary | ICD-10-CM

## 2014-05-21 DIAGNOSIS — R0602 Shortness of breath: Secondary | ICD-10-CM

## 2014-05-22 ENCOUNTER — Telehealth: Payer: Self-pay | Admitting: *Deleted

## 2014-05-22 NOTE — Telephone Encounter (Signed)
Notes Recorded by Lesle ChrisAngela G Hill, LPN on 16/1/096011/11/2013 at 11:38 AM Patient notified. She will call back to make appointment in February.

## 2014-05-22 NOTE — Telephone Encounter (Signed)
-----   Message from Antoine PocheJonathan F Branch, MD sent at 05/22/2014 10:16 AM EST ----- Echo shows overall her heart function is good. The pumping function is nice and strong, and her heart valves all work normally. Her heart muscle is mildly stiff (this often happens as we age), and this could explain some of her swelling, but typically swelling from this is seen only in the legs and not in the pattern she described last visit. Overall heart looks good, would like her to f/u with us in 3 months   Dominga FerryJ Branch MD

## 2014-05-22 NOTE — Telephone Encounter (Signed)
Pt lost 7 lbs and now weight back up 9 lbs in one week. Slight SOB and some swelling in left hand and legs. Pt states not able to take full dose of Lasix because of job. 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 Do we need to take her out of work or what do you recommend. Pt taking 1/2 pill on M,W, F and 1 pill all other days. She wants to know if she could take 1 in morning and 1/2 in afternoon?

## 2014-05-22 NOTE — Telephone Encounter (Signed)
Please increase Lasix to 2 pills or 40 mg total daily and make sure patient has appointment to be seen in the next week

## 2014-05-22 NOTE — Telephone Encounter (Signed)
Pt aware to increase Lasix to 40mg  daily. Her appt is for 11/17

## 2014-06-03 ENCOUNTER — Ambulatory Visit (INDEPENDENT_AMBULATORY_CARE_PROVIDER_SITE_OTHER): Payer: BC Managed Care – PPO | Admitting: Family Medicine

## 2014-06-03 ENCOUNTER — Encounter: Payer: Self-pay | Admitting: Family Medicine

## 2014-06-03 ENCOUNTER — Encounter (INDEPENDENT_AMBULATORY_CARE_PROVIDER_SITE_OTHER): Payer: Self-pay

## 2014-06-03 VITALS — BP 118/75 | HR 62 | Temp 98.0°F | Ht 64.5 in | Wt 192.0 lb

## 2014-06-03 DIAGNOSIS — K432 Incisional hernia without obstruction or gangrene: Secondary | ICD-10-CM

## 2014-06-03 DIAGNOSIS — R109 Unspecified abdominal pain: Secondary | ICD-10-CM

## 2014-06-03 DIAGNOSIS — R799 Abnormal finding of blood chemistry, unspecified: Secondary | ICD-10-CM

## 2014-06-03 DIAGNOSIS — R609 Edema, unspecified: Secondary | ICD-10-CM

## 2014-06-03 NOTE — Patient Instructions (Addendum)
Get some OTC support hose to wear daily when awake We will schedule a Ultrasound of abdomen and pelvis for the abdominal pain   Let us know the name of the surgeon - about the hernia We will do BMP- to check kidney function today.  We want to see you back in 6 weeks Please purchase support hose and put these on the first thing every morning We will arrange to get an ultrasound of your abdomen and pelvis Find out the name of the surgeon that you would like to see regarding your incisional hernia and we will arrange an appointment for you to see him

## 2014-06-03 NOTE — Progress Notes (Signed)
Subjective:    Patient ID: Rita Diaz, female    DOB: August 05, 1955, 58 y.o.   MRN: [id number]  HPI Patient here today for a 6 week follow up on Shortness of breath, edema, and fatigue.all of the patient's lab work x-rays and external visits with cardiology were reviewed. She has an understanding of all of these evaluations. She is still taking 40 mg of Lasix daily, to 20 mg pills. The edema is improved and her arm but still persists and is worse at the end of the day in her left leg.       Patient Active Problem List   Diagnosis Date Noted  . Recurrent ventral hernia 05/27/2013  . Kidney stone 12/06/2012  . S/P repair of recurrent ventral hernia 08/27/2012  . Abdominal pain, chronic, left upper quadrant 08/27/2012   Outpatient Encounter Prescriptions as of 06/03/2014  Medication Sig  . ALPRAZolam (XANAX) 0.5 MG tablet Take 1 tablet (0.5 mg total) by mouth 3 (three) times daily as needed for anxiety.  . furosemide (LASIX) 20 MG tablet One po qd 2 - 3 days prn swelling prn severe swelling    Review of Systems  Constitutional: Negative.   HENT: Negative.   Eyes: Negative.   Respiratory: Negative.   Cardiovascular: Leg swelling: and feet - still has edema - worse at night.  Gastrointestinal: Negative.   Endocrine: Negative.   Genitourinary: Negative.   Musculoskeletal: Negative.   Skin: Negative.   Allergic/Immunologic: Negative.   Neurological: Negative.   Hematological: Negative.   Psychiatric/Behavioral: Negative.        Objective:   Physical Exam  Constitutional: She is oriented to person, place, and time. She appears well-developed and well-nourished. No distress.  HENT:  Head: Normocephalic and atraumatic.  Eyes: Conjunctivae and EOM are normal. Pupils are equal, round, and reactive to light. Right eye exhibits no discharge. Left eye exhibits no discharge. No scleral icterus.  Neck: Normal range of motion.  Cardiovascular: Normal rate, regular rhythm, normal  heart sounds and intact distal pulses.   No murmur heard. Pulmonary/Chest: Effort normal and breath sounds normal. No respiratory distress. She has no wheezes. She has no rales. She exhibits no tenderness.  Abdominal: Soft. Bowel sounds are normal. She exhibits no mass. There is tenderness. There is no rebound and no guarding.  The patient has a large vertical midline incision which has a weakness in  It. There is slight tenderness in the left upper quadrant.  Musculoskeletal: Normal range of motion. She exhibits edema. She exhibits no tenderness.  There is 1+ pretibial edema in the left lower extremity  Lymphadenopathy:    She has no cervical adenopathy.  Neurological: She is alert and oriented to person, place, and time.  Skin: Skin is warm and dry. No rash noted.  Psychiatric: She has a normal mood and affect. Her behavior is normal. Judgment and thought content normal.  Nursing note and vitals reviewed.   BP 118/75 mmHg  Pulse 62  Temp(Src) 98 F (36.7 C) (Oral)  Ht 5' 4.5" (1.638 m)  Wt 192 lb (87.091 kg)  BMI 32.46 kg/m2       Assessment & Plan:  1. Edema -continue Lasix 40 mg daily -Use support hose and put these on the first thing every morning  2. Abdominal pain, unspecified abdominal location -ultrasound of abdomen and pelvis  3.incisional hernia -Arrange appointment with surgeon for patient to follow-up, patient will call with the name of preferred surgeon  Patient Instructions  Get  some OTC support hose to wear daily when awake We will schedule a Ultrasound of abdomen and pelvis for the abdominal pain   Let us know the name of the surgeon - about the hernia We will do BMP- to check kidney function today.  We want to see you back in 6 weeks Please purchase support hose and put these on the first thing every morning We will arrange to get an ultrasound of your abdomen and pelvis Find out the name of the surgeon that you would like to see regarding your  incisional hernia and we will arrange an appointment for you to see him   Nyra Capeson W. Eriyah Fernando MD

## 2014-06-04 ENCOUNTER — Other Ambulatory Visit: Payer: Self-pay | Admitting: Nurse Practitioner

## 2014-06-04 DIAGNOSIS — R109 Unspecified abdominal pain: Secondary | ICD-10-CM

## 2014-06-04 LAB — BMP8+EGFR
BUN/Creatinine Ratio: 15 (ref 9–23)
BUN: 14 mg/dL (ref 6–24)
CO2: 34 mmol/L — ABNORMAL HIGH (ref 18–29)
Calcium: 10.4 mg/dL — ABNORMAL HIGH (ref 8.7–10.2)
Chloride: 97 mmol/L (ref 97–108)
Creatinine, Ser: 0.92 mg/dL (ref 0.57–1.00)
GFR calc Af Amer: 79 mL/min/{1.73_m2}
GFR calc non Af Amer: 69 mL/min/{1.73_m2}
Glucose: 83 mg/dL (ref 65–99)
Potassium: 4.5 mmol/L (ref 3.5–5.2)
Sodium: 142 mmol/L (ref 134–144)

## 2014-06-05 ENCOUNTER — Other Ambulatory Visit: Payer: Self-pay | Admitting: Nurse Practitioner

## 2014-06-05 DIAGNOSIS — R109 Unspecified abdominal pain: Secondary | ICD-10-CM

## 2014-06-05 NOTE — Telephone Encounter (Signed)
lmt[facility] to go over labs

## 2014-06-05 NOTE — Telephone Encounter (Signed)
-----   Message from Ernestina Pennaonald W Moore, MD sent at 06/04/2014  7:24 AM EST ----- The blood sugar, renal and electrolytes are good except the serum calcium is slightly elevated---we will follow this in the future----lab please add a Hgb A1C+++++

## 2014-06-09 ENCOUNTER — Other Ambulatory Visit: Payer: Self-pay | Admitting: Family Medicine

## 2014-06-09 ENCOUNTER — Other Ambulatory Visit (HOSPITAL_COMMUNITY): Payer: BC Managed Care – PPO

## 2014-06-09 ENCOUNTER — Ambulatory Visit (HOSPITAL_COMMUNITY)
Admission: RE | Admit: 2014-06-09 | Discharge: 2014-06-09 | Disposition: A | Discharge status: Home / Self Care | Payer: BC Managed Care – PPO | Source: Ambulatory Visit | Attending: Family Medicine | Admitting: Family Medicine

## 2014-06-09 ENCOUNTER — Telehealth: Payer: Self-pay

## 2014-06-09 ENCOUNTER — Ambulatory Visit (HOSPITAL_COMMUNITY)
Admission: RE | Admit: 2014-06-09 | Discharge: 2014-06-09 | Disposition: A | Discharge status: Home / Self Care | Payer: BC Managed Care – PPO | Source: Ambulatory Visit | Attending: Nurse Practitioner | Admitting: Nurse Practitioner

## 2014-06-09 DIAGNOSIS — R935 Abnormal findings on diagnostic imaging of other abdominal regions, including retroperitoneum: Secondary | ICD-10-CM | POA: Diagnosis not present

## 2014-06-09 DIAGNOSIS — R109 Unspecified abdominal pain: Secondary | ICD-10-CM | POA: Diagnosis present

## 2014-06-09 DIAGNOSIS — R609 Edema, unspecified: Secondary | ICD-10-CM

## 2014-06-09 DIAGNOSIS — R1902 Left upper quadrant abdominal swelling, mass and lump: Secondary | ICD-10-CM

## 2014-06-09 NOTE — Telephone Encounter (Signed)
Pt aware of results and agrees to CT

## 2014-06-09 NOTE — Telephone Encounter (Signed)
[facility] to X-ray 

## 2014-06-09 NOTE — Telephone Encounter (Signed)
-----   Message from Ernestina Pennaonald W Moore, MD sent at 06/09/2014 10:47 AM EST ----- As per radiology report----please schedule a CT as reccommended. Tell patient she has fatty Liver and possible recurrence of hiatal. The CT will give us more infor

## 2014-06-11 ENCOUNTER — Ambulatory Visit (HOSPITAL_COMMUNITY)
Admission: RE | Admit: 2014-06-11 | Discharge: 2014-06-11 | Disposition: A | Discharge status: Home / Self Care | Payer: BC Managed Care – PPO | Source: Ambulatory Visit | Attending: Family Medicine | Admitting: Family Medicine

## 2014-06-11 ENCOUNTER — Telehealth: Payer: Self-pay | Admitting: Family Medicine

## 2014-06-11 DIAGNOSIS — N200 Calculus of kidney: Secondary | ICD-10-CM | POA: Insufficient documentation

## 2014-06-11 DIAGNOSIS — K760 Fatty (change of) liver, not elsewhere classified: Secondary | ICD-10-CM | POA: Diagnosis not present

## 2014-06-11 DIAGNOSIS — R109 Unspecified abdominal pain: Secondary | ICD-10-CM | POA: Diagnosis present

## 2014-06-11 DIAGNOSIS — K439 Ventral hernia without obstruction or gangrene: Secondary | ICD-10-CM | POA: Diagnosis not present

## 2014-06-11 DIAGNOSIS — R1902 Left upper quadrant abdominal swelling, mass and lump: Secondary | ICD-10-CM

## 2014-06-11 LAB — POCT GLYCOSYLATED HEMOGLOBIN (HGB A1C): Hemoglobin A1C: 5.6

## 2014-06-11 NOTE — Addendum Note (Signed)
Addended by: Monica BectonHODGES, Caellum Mancil F on: 06/11/2014 08:48 AM   Modules accepted: Orders

## 2014-06-11 NOTE — Telephone Encounter (Signed)
-----   Message from Ernestina Pennaonald W Moore, MD sent at 06/11/2014  2:41 PM EST ----- Please review these results with the patient. The gallbladder, uterus, and appendix are absent. There is some scarring in the upper abdomen which is stable. There is a small ventral hernia. There is no bowel obstruction or abscess. There is a fatty liver. There is a nodular opacity in the right lobe which is stable and felt to have a benign etiology. There is a 1 mm nonobstructing kidney stone in the mid right kidney.

## 2014-06-11 NOTE — Telephone Encounter (Signed)
[facility] to X-ray 

## 2014-06-11 NOTE — Telephone Encounter (Signed)
Pt aware of results 

## 2014-06-21 ENCOUNTER — Ambulatory Visit (INDEPENDENT_AMBULATORY_CARE_PROVIDER_SITE_OTHER): Payer: BC Managed Care – PPO

## 2014-06-21 DIAGNOSIS — Z23 Encounter for immunization: Secondary | ICD-10-CM

## 2014-06-23 ENCOUNTER — Ambulatory Visit (INDEPENDENT_AMBULATORY_CARE_PROVIDER_SITE_OTHER): Payer: Self-pay | Admitting: Surgery

## 2014-06-25 ENCOUNTER — Other Ambulatory Visit (HOSPITAL_COMMUNITY): Payer: Self-pay | Admitting: *Deleted

## 2014-06-25 NOTE — Patient Instructions (Addendum)
Rita Diaz  06/25/2014   Your procedure is scheduled on: 07/02/14  [facility]  Entrance and follow signs to               Short Stay Center at 12:00 PM  Call this number if you have problems the morning of surgery [phone]   Remember:  Do not eat food  :After Midnight              MAY HAVE CLEAR LIQUIDS UNTIL 8:00 AM              STOP ASPIRIN AND HERBAL MEDS 5 DAYS PREOP     CLEAR LIQUID DIET   Foods Allowed                                                                     Foods Excluded  Coffee and tea, regular and decaf                             liquids that you cannot  Plain Jell-O in any flavor                                             see through such as: Fruit ices (not with fruit pulp)                                     milk, soups, orange juice  Iced Popsicles                                    All solid food Carbonated beverages, regular and diet                                    Cranberry, grape and apple juices Sports drinks like Gatorade Lightly seasoned clear broth or consume(fat free) Sugar, honey syrup  _____________________________________________________________________  .     Take these medicines the morning of surgery with A SIP OF WATER: MAY TAKE XANAX IF NEEDED                               You may not have any metal on your body including hair pins and              piercings  Do not wear jewelry, make-up, lotions, powders or perfumes.             Do not wear nail polish.  Do not shave  48 hours prior to surgery.              Men may shave face and neck.   Do not bring valuables to the hospital. [facility] IS NOT  RESPONSIBLE   FOR VALUABLES.  Contacts, dentures or bridgework may not be worn into surgery.  Leave suitcase in the car. After surgery it may be brought to your room.     Patients discharged the day of surgery will not be allowed to drive home.  Name and phone  number of your driver             Please read over the following fact sheets you were given: _____________________________________________________________________                                                     [facility] - PREPARING FOR SURGERY  Before surgery, you can play an important role.  Because skin is not sterile, your skin needs to be as free of germs as possible.  You can reduce the number of germs on your skin by washing with CHG (chlorahexidine gluconate) soap before surgery.  CHG is an antiseptic cleaner which kills germs and bonds with the skin to continue killing germs even after washing. Please DO NOT use if you have an allergy to CHG or antibacterial soaps.  If your skin becomes reddened/irritated stop using the CHG and inform your nurse when you arrive at Short Stay. Do not shave (including legs and underarms) for at least 48 hours prior to the first CHG shower.  You may shave your face. Please follow these instructions carefully:   1.  Shower with CHG Soap the night before surgery and the  morning of Surgery.   2.  If you choose to wash your hair, wash your hair first as usual with your  normal  Shampoo.   3.  After you shampoo, rinse your hair and body thoroughly to remove the  shampoo.                                         4.  Use CHG as you would any other liquid soap.  You can apply chg directly  to the skin and wash . Gently wash with scrungie or clean wascloth    5.  Apply the CHG Soap to your body ONLY FROM THE NECK DOWN.   Do not use on open                           Wound or open sores. Avoid contact with eyes, ears mouth and genitals (private parts).                        Genitals (private parts) with your normal soap.              6.  Wash thoroughly, paying special attention to the area where your surgery  will be performed.   7.  Thoroughly rinse your body with warm water from the neck down.   8.  DO NOT shower/wash with your normal soap after  using and rinsing off  the CHG Soap .                9.  Pat yourself dry with a clean towel.             10.  Wear clean pajamas.  11.  Place clean sheets on your bed the night of your first shower and do not  sleep with pets.  Day of Surgery : Do not apply any lotions/deodorants the morning of surgery.  Please wear clean clothes to the hospital/surgery center.  FAILURE TO FOLLOW THESE INSTRUCTIONS MAY RESULT IN THE CANCELLATION OF YOUR SURGERY    PATIENT SIGNATURE_________________________________  ______________________________________________________________________

## 2014-06-26 ENCOUNTER — Encounter (HOSPITAL_COMMUNITY)
Admission: RE | Admit: 2014-06-26 | Discharge: 2014-06-26 | Disposition: A | Discharge status: Home / Self Care | Payer: BC Managed Care – PPO | Source: Ambulatory Visit | Attending: Surgery | Admitting: Surgery

## 2014-06-26 ENCOUNTER — Encounter (HOSPITAL_COMMUNITY): Payer: Self-pay

## 2014-06-26 DIAGNOSIS — Z87442 Personal history of urinary calculi: Secondary | ICD-10-CM | POA: Diagnosis not present

## 2014-06-26 DIAGNOSIS — K219 Gastro-esophageal reflux disease without esophagitis: Secondary | ICD-10-CM | POA: Diagnosis not present

## 2014-06-26 DIAGNOSIS — K439 Ventral hernia without obstruction or gangrene: Secondary | ICD-10-CM | POA: Diagnosis present

## 2014-06-26 HISTORY — DX: Ventral hernia without obstruction or gangrene: K43.9

## 2014-06-26 HISTORY — DX: Personal history of urinary calculi: Z87.442

## 2014-06-26 HISTORY — DX: Edema, unspecified: R60.9

## 2014-06-26 HISTORY — DX: Anxiety disorder, unspecified: F41.9

## 2014-06-26 LAB — BASIC METABOLIC PANEL
Anion gap: 12 (ref 5–15)
BUN: 18 mg/dL (ref 6–23)
CO2: 28 meq/L (ref 19–32)
Calcium: 9.4 mg/dL (ref 8.4–10.5)
Chloride: 104 mEq/L (ref 96–112)
Creatinine, Ser: 0.8 mg/dL (ref 0.50–1.10)
GFR calc Af Amer: >90 mL/min (ref >90)
GFR, EST NON AFRICAN AMERICAN: 80 mL/min — AB (ref >90)
Glucose, Bld: 107 mg/dL — ABNORMAL HIGH (ref 70–99)
POTASSIUM: 4.1 meq/L (ref 3.7–5.3)
SODIUM: 144 meq/L (ref 137–147)

## 2014-06-26 LAB — CBC
HEMATOCRIT: 41.2 % (ref 36.0–46.0)
Hemoglobin: 13.9 g/dL (ref 12.0–15.0)
MCH: 29.3 pg (ref 26.0–34.0)
MCHC: 33.7 g/dL (ref 30.0–36.0)
MCV: 86.7 fL (ref 78.0–100.0)
PLATELETS: 231 10*3/uL (ref 150–400)
RBC: 4.75 MIL/uL (ref 3.87–5.11)
RDW: 12.4 % (ref 11.5–15.5)
WBC: 5 10*3/uL (ref 4.0–10.5)

## 2014-06-26 LAB — SURGICAL PCR SCREEN
MRSA, PCR: NEGATIVE
STAPHYLOCOCCUS AUREUS: NEGATIVE

## 2014-07-02 ENCOUNTER — Inpatient Hospital Stay (HOSPITAL_COMMUNITY): Payer: BC Managed Care – PPO | Admitting: Certified Registered Nurse Anesthetist

## 2014-07-02 ENCOUNTER — Encounter (HOSPITAL_COMMUNITY)
Admission: RE | Disposition: A | Discharge status: Home / Self Care | Payer: Self-pay | Source: Ambulatory Visit | Attending: Surgery

## 2014-07-02 ENCOUNTER — Observation Stay (HOSPITAL_COMMUNITY)
Admission: RE | Admit: 2014-07-02 | Discharge: 2014-07-04 | Disposition: A | Discharge status: Home / Self Care | Payer: BC Managed Care – PPO | Source: Ambulatory Visit | Attending: Surgery | Admitting: Surgery

## 2014-07-02 ENCOUNTER — Encounter (HOSPITAL_COMMUNITY): Payer: Self-pay | Admitting: *Deleted

## 2014-07-02 DIAGNOSIS — K439 Ventral hernia without obstruction or gangrene: Secondary | ICD-10-CM

## 2014-07-02 DIAGNOSIS — K219 Gastro-esophageal reflux disease without esophagitis: Secondary | ICD-10-CM | POA: Insufficient documentation

## 2014-07-02 DIAGNOSIS — Z87442 Personal history of urinary calculi: Secondary | ICD-10-CM | POA: Insufficient documentation

## 2014-07-02 HISTORY — PX: INSERTION OF MESH: SHX5868

## 2014-07-02 HISTORY — DX: Ventral hernia without obstruction or gangrene: K43.9

## 2014-07-02 HISTORY — PX: VENTRAL HERNIA REPAIR: SHX424

## 2014-07-02 LAB — CBC
HEMATOCRIT: 42.5 % (ref 36.0–46.0)
HEMOGLOBIN: 14.3 g/dL (ref 12.0–15.0)
MCH: 29.2 pg (ref 26.0–34.0)
MCHC: 33.6 g/dL (ref 30.0–36.0)
MCV: 86.9 fL (ref 78.0–100.0)
Platelets: 221 10*3/uL (ref 150–400)
RBC: 4.89 MIL/uL (ref 3.87–5.11)
RDW: 12.4 % (ref 11.5–15.5)
WBC: 7 10*3/uL (ref 4.0–10.5)

## 2014-07-02 LAB — CREATININE, SERUM
CREATININE: 0.81 mg/dL (ref 0.50–1.10)
GFR calc Af Amer: >90 mL/min (ref >90)
GFR calc non Af Amer: 79 mL/min — ABNORMAL LOW (ref >90)

## 2014-07-02 SURGERY — REPAIR, HERNIA, VENTRAL
Anesthesia: General | Site: Abdomen

## 2014-07-02 MED ORDER — PROPOFOL 10 MG/ML IV BOLUS
INTRAVENOUS | Status: AC
  Filled 2014-07-02: qty 20

## 2014-07-02 MED ORDER — CEFAZOLIN SODIUM 1-5 GM-% IV SOLN
1.0000 g | Freq: Four times a day (QID) | INTRAVENOUS | Status: AC
  Administered 2014-07-02 – 2014-07-03 (×3): 1 g via INTRAVENOUS
  Filled 2014-07-02 (×3): qty 50

## 2014-07-02 MED ORDER — KETOROLAC TROMETHAMINE 30 MG/ML IJ SOLN
INTRAMUSCULAR | Status: DC | PRN
  Administered 2014-07-02: 30 mg via INTRAVENOUS

## 2014-07-02 MED ORDER — SUCCINYLCHOLINE CHLORIDE 20 MG/ML IJ SOLN
INTRAMUSCULAR | Status: DC | PRN
  Administered 2014-07-02: 100 mg via INTRAVENOUS

## 2014-07-02 MED ORDER — OXYCODONE-ACETAMINOPHEN 5-325 MG PO TABS
1.0000 | ORAL_TABLET | ORAL | Status: DC | PRN
  Administered 2014-07-03 – 2014-07-04 (×5): 2 via ORAL
  Filled 2014-07-02 (×5): qty 2

## 2014-07-02 MED ORDER — ONDANSETRON HCL 4 MG PO TABS
4.0000 mg | ORAL_TABLET | Freq: Four times a day (QID) | ORAL | Status: DC | PRN
  Administered 2014-07-03: 4 mg via ORAL
  Filled 2014-07-02: qty 1

## 2014-07-02 MED ORDER — HYDROMORPHONE HCL 1 MG/ML IJ SOLN
INTRAMUSCULAR | Status: AC
  Filled 2014-07-02: qty 1

## 2014-07-02 MED ORDER — KETOROLAC TROMETHAMINE 15 MG/ML IJ SOLN
15.0000 mg | Freq: Four times a day (QID) | INTRAMUSCULAR | Status: DC
  Administered 2014-07-02 – 2014-07-04 (×7): 15 mg via INTRAVENOUS
  Filled 2014-07-02 (×10): qty 1

## 2014-07-02 MED ORDER — 0.9 % SODIUM CHLORIDE (POUR BTL) OPTIME
TOPICAL | Status: DC | PRN
  Administered 2014-07-02: 1000 mL

## 2014-07-02 MED ORDER — CEFAZOLIN SODIUM-DEXTROSE 2-3 GM-% IV SOLR
INTRAVENOUS | Status: AC
  Filled 2014-07-02: qty 50

## 2014-07-02 MED ORDER — CEFAZOLIN SODIUM-DEXTROSE 2-3 GM-% IV SOLR
2.0000 g | INTRAVENOUS | Status: AC
  Administered 2014-07-02: 2 g via INTRAVENOUS

## 2014-07-02 MED ORDER — MIDAZOLAM HCL 2 MG/2ML IJ SOLN
INTRAMUSCULAR | Status: AC
  Filled 2014-07-02: qty 2

## 2014-07-02 MED ORDER — SUFENTANIL CITRATE 50 MCG/ML IV SOLN
INTRAVENOUS | Status: DC | PRN
  Administered 2014-07-02 (×2): 10 ug via INTRAVENOUS
  Administered 2014-07-02: 20 ug via INTRAVENOUS

## 2014-07-02 MED ORDER — CHLORHEXIDINE GLUCONATE 4 % EX LIQD: 1.0000 "application " | Freq: Once | CUTANEOUS | Status: DC

## 2014-07-02 MED ORDER — ONDANSETRON HCL 4 MG/2ML IJ SOLN
INTRAMUSCULAR | Status: DC | PRN
  Administered 2014-07-02: 4 mg via INTRAVENOUS

## 2014-07-02 MED ORDER — PROMETHAZINE HCL 25 MG/ML IJ SOLN: 6.2500 mg | INTRAMUSCULAR | Status: DC | PRN

## 2014-07-02 MED ORDER — SODIUM CHLORIDE 0.9 % IJ SOLN
INTRAMUSCULAR | Status: AC
  Filled 2014-07-02: qty 10

## 2014-07-02 MED ORDER — BUPIVACAINE-EPINEPHRINE (PF) 0.25% -1:200000 IJ SOLN
INTRAMUSCULAR | Status: AC
  Filled 2014-07-02: qty 30

## 2014-07-02 MED ORDER — DEXAMETHASONE SODIUM PHOSPHATE 10 MG/ML IJ SOLN
INTRAMUSCULAR | Status: AC
  Filled 2014-07-02: qty 1

## 2014-07-02 MED ORDER — NEOSTIGMINE METHYLSULFATE 10 MG/10ML IV SOLN
INTRAVENOUS | Status: DC | PRN
  Administered 2014-07-02: 4 mg via INTRAVENOUS

## 2014-07-02 MED ORDER — LIDOCAINE HCL (CARDIAC) 20 MG/ML IV SOLN
INTRAVENOUS | Status: AC
  Filled 2014-07-02: qty 5

## 2014-07-02 MED ORDER — ENOXAPARIN SODIUM 40 MG/0.4ML ~~LOC~~ SOLN
40.0000 mg | SUBCUTANEOUS | Status: DC
  Administered 2014-07-03 – 2014-07-04 (×2): 40 mg via SUBCUTANEOUS
  Filled 2014-07-02 (×2): qty 0.4

## 2014-07-02 MED ORDER — GLYCOPYRROLATE 0.2 MG/ML IJ SOLN
INTRAMUSCULAR | Status: AC
  Filled 2014-07-02: qty 3

## 2014-07-02 MED ORDER — MIDAZOLAM HCL 5 MG/5ML IJ SOLN
INTRAMUSCULAR | Status: DC | PRN
  Administered 2014-07-02: 2 mg via INTRAVENOUS

## 2014-07-02 MED ORDER — BUPIVACAINE-EPINEPHRINE 0.25% -1:200000 IJ SOLN
INTRAMUSCULAR | Status: DC | PRN
  Administered 2014-07-02: 20 mL

## 2014-07-02 MED ORDER — ALPRAZOLAM 0.5 MG PO TABS
0.5000 mg | ORAL_TABLET | Freq: Three times a day (TID) | ORAL | Status: DC | PRN
  Administered 2014-07-04: 0.5 mg via ORAL
  Filled 2014-07-02: qty 1

## 2014-07-02 MED ORDER — CISATRACURIUM BESYLATE (PF) 10 MG/5ML IV SOLN
INTRAVENOUS | Status: DC | PRN
  Administered 2014-07-02: 6 mg via INTRAVENOUS
  Administered 2014-07-02: 4 mg via INTRAVENOUS

## 2014-07-02 MED ORDER — CISATRACURIUM BESYLATE 20 MG/10ML IV SOLN
INTRAVENOUS | Status: AC
Start: 2014-07-02 — End: 2014-07-02
  Filled 2014-07-02: qty 10

## 2014-07-02 MED ORDER — SUFENTANIL CITRATE 50 MCG/ML IV SOLN
INTRAVENOUS | Status: AC
  Filled 2014-07-02: qty 1

## 2014-07-02 MED ORDER — KETOROLAC TROMETHAMINE 30 MG/ML IJ SOLN
INTRAMUSCULAR | Status: AC
  Filled 2014-07-02: qty 1

## 2014-07-02 MED ORDER — HYDROMORPHONE HCL 1 MG/ML IJ SOLN
1.0000 mg | INTRAMUSCULAR | Status: DC | PRN
  Administered 2014-07-02: 1 mg via INTRAVENOUS
  Filled 2014-07-02: qty 1

## 2014-07-02 MED ORDER — SODIUM CHLORIDE 0.9 % IV SOLN
INTRAVENOUS | Status: DC
  Administered 2014-07-02 – 2014-07-03 (×2): via INTRAVENOUS

## 2014-07-02 MED ORDER — PROPOFOL 10 MG/ML IV BOLUS
INTRAVENOUS | Status: DC | PRN
  Administered 2014-07-02: 150 mg via INTRAVENOUS

## 2014-07-02 MED ORDER — ONDANSETRON HCL 4 MG/2ML IJ SOLN: 4.0000 mg | Freq: Four times a day (QID) | INTRAMUSCULAR | Status: DC | PRN

## 2014-07-02 MED ORDER — GLYCOPYRROLATE 0.2 MG/ML IJ SOLN
INTRAMUSCULAR | Status: DC | PRN
  Administered 2014-07-02: .6 mg via INTRAVENOUS

## 2014-07-02 MED ORDER — DEXAMETHASONE SODIUM PHOSPHATE 10 MG/ML IJ SOLN
INTRAMUSCULAR | Status: DC | PRN
  Administered 2014-07-02: 10 mg via INTRAVENOUS

## 2014-07-02 MED ORDER — HYDROMORPHONE HCL 1 MG/ML IJ SOLN
0.2500 mg | INTRAMUSCULAR | Status: DC | PRN
  Administered 2014-07-02 (×3): 0.5 mg via INTRAVENOUS

## 2014-07-02 MED ORDER — LACTATED RINGERS IV SOLN
INTRAVENOUS | Status: DC
  Administered 2014-07-02: 16:00:00 via INTRAVENOUS
  Administered 2014-07-02: 1000 mL via INTRAVENOUS

## 2014-07-02 MED ORDER — LIDOCAINE HCL (CARDIAC) 20 MG/ML IV SOLN
INTRAVENOUS | Status: DC | PRN
  Administered 2014-07-02: 100 mg via INTRAVENOUS

## 2014-07-02 SURGICAL SUPPLY — 44 items
BENZOIN TINCTURE PRP APPL 2/3 (GAUZE/BANDAGES/DRESSINGS) ×3 IMPLANT
BINDER ABDOMINAL 12 ML 46-62 (SOFTGOODS) IMPLANT
BLADE HEX COATED 2.75 (ELECTRODE) ×3 IMPLANT
CANISTER SUCT 3000ML (MISCELLANEOUS) IMPLANT
DECANTER SPIKE VIAL GLASS SM (MISCELLANEOUS) IMPLANT
DRAIN CHANNEL 10F 3/8 F FF (DRAIN) IMPLANT
DRAIN CHANNEL RND F F (WOUND CARE) IMPLANT
DRAPE LAPAROSCOPIC ABDOMINAL (DRAPES) ×3 IMPLANT
DRAPE UTILITY XL STRL (DRAPES) ×3 IMPLANT
DRESSING TELFA ISLAND 4X8 (GAUZE/BANDAGES/DRESSINGS) ×3 IMPLANT
ELECT REM PT RETURN 9FT ADLT (ELECTROSURGICAL) ×3
ELECTRODE REM PT RTRN 9FT ADLT (ELECTROSURGICAL) ×2 IMPLANT
EVACUATOR SILICONE 100CC (DRAIN) IMPLANT
GAUZE SPONGE 4X4 12PLY STRL (GAUZE/BANDAGES/DRESSINGS) IMPLANT
GLOVE BIO SURGEON STRL SZ7 (GLOVE) ×3 IMPLANT
GLOVE BIOGEL PI IND STRL 7.0 (GLOVE) ×2 IMPLANT
GLOVE BIOGEL PI IND STRL 7.5 (GLOVE) ×2 IMPLANT
GLOVE BIOGEL PI INDICATOR 7.0 (GLOVE) ×1
GLOVE BIOGEL PI INDICATOR 7.5 (GLOVE) ×1
GOWN STRL REUS W/TWL LRG LVL3 (GOWN DISPOSABLE) ×6 IMPLANT
GOWN STRL REUS W/TWL XL LVL3 (GOWN DISPOSABLE) IMPLANT
KIT BASIN OR (CUSTOM PROCEDURE TRAY) ×3 IMPLANT
MANIFOLD NEPTUNE II (INSTRUMENTS) ×3 IMPLANT
MESH VENTRALEX ST 1-7/10 CRC S (Mesh General) ×3 IMPLANT
NEEDLE HYPO 22GX1.5 SAFETY (NEEDLE) IMPLANT
PACK GENERAL/GYN (CUSTOM PROCEDURE TRAY) ×3 IMPLANT
STAPLER VISISTAT 35W (STAPLE) IMPLANT
STRIP CLOSURE SKIN 1/2X4 (GAUZE/BANDAGES/DRESSINGS) ×3 IMPLANT
SUT ETHILON 2 0 PS N (SUTURE) IMPLANT
SUT MNCRL AB 4-0 PS2 18 (SUTURE) ×3 IMPLANT
SUT NOVA NAB DX-16 0-1 5-0 T12 (SUTURE) ×3 IMPLANT
SUT NOVA NAB GS-21 0 18 T12 DT (SUTURE) ×3 IMPLANT
SUT PDS AB 1 CTX 36 (SUTURE) IMPLANT
SUT PROLENE 0 CT 1 CR/8 (SUTURE) IMPLANT
SUT SILK 3 0 (SUTURE)
SUT SILK 3-0 18XBRD TIE 12 (SUTURE) IMPLANT
SUT VIC AB 2-0 CT1 27 (SUTURE)
SUT VIC AB 2-0 CT1 27XBRD (SUTURE) IMPLANT
SUT VIC AB 3-0 SH 18 (SUTURE) ×3 IMPLANT
SUT VIC AB 3-0 SH 27 (SUTURE)
SUT VIC AB 3-0 SH 27XBRD (SUTURE) IMPLANT
SYR CONTROL 10ML LL (SYRINGE) IMPLANT
TOWEL OR 17X26 10 PK STRL BLUE (TOWEL DISPOSABLE) ×3 IMPLANT
TRAY FOLEY CATH 14FRSI W/METER (CATHETERS) IMPLANT

## 2014-07-02 NOTE — Op Note (Signed)
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 Anesthesia:  GETT Indications:   This is a 58 year old female who is several years status post open ventral hernia repair with mesh. She has been having a lot of pain associated with this repair. On examination she was felt to have a slight bulge but no obvious herniation. A CT scan showed no sign of fascial defect when it was obtained last year. However she had a repeat CT scan recently that showed a small hernia just above her umbilicus. I offered her repair of this new hernia along with examination of the fascia over her previous repair to see if there was a small hernia that was not showing up on CT scan.  Description of procedure: the patient is brought to the operating room and placed in a supine position on the operating table. After an adequate level of general anesthesia was obtained , her abdomen was prepped with ChloraPrep and draped sterile fashion. A timeout was taken to ensure the proper patient proper procedure. I infiltrated the area over her Reavis incision with 0.25% Marcaine with epinephrine. A palpable bulge is noted in the middle of her previous incision. We started by making an incision directly over this bulge carrying this down to the umbilicus. We dissected down into the subcutaneous tissues with cautery. The palpable bulge turned out to not represent a hernia but was merely a collection of fatty tissue. This was completely excised. We dissected down to the fascia and exposed the entire length of the fascia throughout the incision. I could not locate any sign of fascial defects. The only hernia that we identified was the new hernia just above the umbilicus. We dissected to the hernia sac and clear to fashion all directions. This defect measures only about  1.5 cm across. Through this fascial defect I could palpate the lower edge of the previously placed hernia mesh. I repaired the new hernia with a 4.3 cm ventralex mesh that was placed behind the fascia. It was secured with 0 Novofil sutures.  The fascia was closed with 1 Novofil.  Some of the subcutaneous fat was excised and the subcutaneous tissues were closed with 3-0 Vicryl.  4-0 Monocryl was used to close the skin.  Steri-strips and clean dressings were applied.  She was extubated and brought to the recovery room in stable condition.  All counts were correct.  Rita ArmsMatthew K. Corliss Skainssuei, MD, [facility]  General/ Trauma Surgery  07/02/2014 4:11 PM

## 2014-07-02 NOTE — Anesthesia Preprocedure Evaluation (Signed)
Anesthesia Evaluation  Patient identified by MRN, date of birth, ID band Patient awake    Reviewed: Allergy & Precautions, H&P , NPO status , Patient's Chart, lab work & pertinent test results  Airway Mallampati: II  TM Distance: >3 FB Neck ROM: Full    Dental no notable dental hx.    Pulmonary neg pulmonary ROS,  breath sounds clear to auscultation  Pulmonary exam normal       Cardiovascular Exercise Tolerance: Good negative cardio ROS  Rhythm:Regular Rate:Normal     Neuro/Psych Anxiety negative neurological ROS     GI/Hepatic Neg liver ROS, hiatal hernia, GERD-  ,  Endo/Other  negative endocrine ROS  Renal/GU Renal diseaseH/O kidney stone.  negative genitourinary   Musculoskeletal negative musculoskeletal ROS (+)   Abdominal (+) + obese,   Peds negative pediatric ROS (+)  Hematology negative hematology ROS (+)   Anesthesia Other Findings   Reproductive/Obstetrics negative OB ROS                             Anesthesia Physical Anesthesia Plan  ASA: II  Anesthesia Plan: General   Post-op Pain Management:    Induction: Intravenous  Airway Management Planned: Oral ETT  Additional Equipment:   Intra-op Plan:   Post-operative Plan: Extubation in OR  Informed Consent: I have reviewed the patients History and Physical, chart, labs and discussed the procedure including the risks, benefits and alternatives for the proposed anesthesia with the patient or authorized representative who has indicated his/her understanding and acceptance.   Dental advisory given  Plan Discussed with: CRNA  Anesthesia Plan Comments:         Anesthesia Quick Evaluation

## 2014-07-02 NOTE — Transfer of Care (Signed)
Immediate Anesthesia Transfer of Care Note  Patient: Rita Diaz  Procedure(s) Performed: Procedure(s): HERNIA REPAIR VENTRAL ADULT WITH MESH (N/A) INSERTION OF MESH  Patient Location: PACU  Anesthesia Type:General  Level of Consciousness: awake and alert   Airway & Oxygen Therapy: Patient Spontanous Breathing and Patient connected to face mask oxygen  Post-op Assessment: Report given to PACU RN and Post -op Vital signs reviewed and stable  Post vital signs: Reviewed and stable  Complications: No apparent anesthesia complications

## 2014-07-02 NOTE — H&P (Signed)
History of Present Illness  Patient words: Rita Diaz.  The patient is a 58 year old female who presents with an incisional Diaz. This is a 58 yo female who is s/p open ventral Diaz repair with mesh in 2010 by Dr. Darlys [place]io [place]. Previously, she had a primary ventral Diaz repair with suture in the 1990's. She has also undergone laparoscopic cholecystectomy. She presented in 2014 with chronic epigastric abdominal pain. In March 2014, she underwent diagnostic laparoscopy with lysis of adhesions. The Diaz mesh was examined and was felt to be intact with no sign of recurrence. She continues to have some abdominal pain, mostly in the LUQ near the costal margin. She has begun to notice some tenderness around her umbilicus. She also notices some bulging in the middle of her upper midline incision. She had a CT scan in November 2014 that showed no sign of recurrent Diaz, but a recent CT scan showed a new ventral Diaz at the umbilicus. Ultrasound had described an 8.4 x 3.7 cm heterogenous density in the LUQ that may be related to scarring from the previous Diaz repair. She presents now to discuss repair.      US 06/09/14 1. 8.4 x 3.7 cm heterogeneous density in the LUQ in the region of the patient's pain and palpable abnormality. This is in the region of prior Diaz repair. The changes could be related to scarring. Recurrent Diaz cannot be exluded. Ct abdomen and pelvis may prove useful for further evaluation.  2. Echogenic liver suggesting fatty infiltration and/or hepatocellular disease       CT A/P 06/11/14 Gallbladder, uterus, and appendix are absent. There is mild scarring in the anterior upper abdomen, stable. There is a small ventral Diaz containing only fat. No bowel obstruction. No abscess. There is hepatic steatosis. There is a 4mm nodular opacity in the right middle lobe which remains stable and is felt to have benign etiology.  There is a 1 mm nonobstructing calculus in the mid right kidney. No hydronephrosis on either side. No ureteral calculi identified.     Other Problems  Cholelithiasis Gastric Ulcer Gastroesophageal Reflux Disease Kidney Stone  Past Surgical History  Cesarean Section - 1 Gallbladder Surgery - Laparoscopic Hysterectomy (due to cancer) - Complete Resection of Stomach  Diagnostic Studies History Colonoscopy 5-10 years ago Mammogram within last year  Allergies  Aspirin EC Lo-Dose *ANALGESICS - NonNarcotic*  Medication History ALPRAZolam (0.5MG  Tablet, Oral prn) Active. Furosemide (20MG  Tablet, Oral qd) Active.  Social History ) No alcohol use No caffeine use No drug use Tobacco use Never smoker.  Family History  Colon Polyps Mother. Diabetes Mellitus Mother. Heart Disease Mother. Heart disease in female family member before age 58 Respiratory Condition Father.  Pregnancy / Birth History  Age at menarche 9 years. Gravida 2 Maternal age 58-30 Para 2 Regular periods  Review of Systems  General Present- Weight Gain. Not Present- Appetite Loss, Chills, Fatigue, Fever, Night Sweats and Weight Loss. Skin Not Present- Change in Wart/Mole, Dryness, Hives, Jaundice, New Lesions, Non-Healing Wounds, Rash and Ulcer. HEENT Not Present- Earache, Hearing Loss, Hoarseness, Nose Bleed, Oral Ulcers, Ringing in the Ears, Seasonal Allergies, Sinus Pain, Sore Throat, Visual Disturbances, Wears glasses/contact lenses and Yellow Eyes. Respiratory Present- Difficulty Breathing. Not Present- Bloody sputum, Chronic Cough, Snoring and Wheezing. Breast Not Present- Breast Mass, Breast Pain, Nipple Discharge and Skin Changes. Cardiovascular Present- Difficulty Breathing Lying Down, Shortness of Breath and Swelling of Extremities. Not Present- Chest Pain, Leg Cramps, Palpitations and  Rapid Heart Rate. Gastrointestinal Present- Abdominal Pain, Bloating, Constipation,  Excessive gas, Gets full quickly at meals and Indigestion. Not Present- Bloody Stool, Change in Bowel Habits, Chronic diarrhea, Difficulty Swallowing, Hemorrhoids, Nausea, Rectal Pain and Vomiting. Musculoskeletal Not Present- Back Pain, Joint Pain, Joint Stiffness, Muscle Pain, Muscle Weakness and Swelling of Extremities. Neurological Present- Headaches. Not Present- Decreased Memory, Fainting, Numbness, Seizures, Tingling, Tremor, Trouble walking and Weakness. Psychiatric Present- Change in Sleep Pattern. Not Present- Anxiety, Bipolar, Depression, Fearful and Frequent crying. Endocrine Not Present- Cold Intolerance, Excessive Hunger, Hair Changes, Heat Intolerance, Hot flashes and New Diabetes. Hematology Not Present- Easy Bruising, Excessive bleeding, Gland problems, HIV and Persistent Infections.   Vitals 06/23/2014 8:44 AM Weight: 192.2 lb Height: 64in Body Surface Area: 1.98 m Body Mass Index: 32.99 kg/m Temp.: 98.70F(Oral)  Pulse: 64 (Regular)  Resp.: 16 (Unlabored)  BP: 142/88 (Sitting, Left Arm, Standard)    Physical ExamThe physical exam findings are as follows: Note:WDWN in NAD HEENT: EOMI, sclera anicteric Neck: No masses, no thyromegaly Lungs: CTA bilaterally; normal respiratory effort CV: Regular rate and rhythm; no murmurs Abd: +bowel sounds, soft, healed upper midline incision with slight bulging in the middle of the scar Mildly tender LUQ over the costal margin Small palpable Diaz just above the umbilicus at the lower end of her previous scar Ext: Well-perfused; no edema Skin: Warm, dry; no sign of jaundice    Assessment & Plan  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 The surgical procedure has been discussed with the patient. Potential risks, benefits, alternative treatments, and expected outcomes have been explained. All of the patient's  questions at this time have been answered. The likelihood of reaching the patient's treatment goal is good. The patient understand the proposed surgical procedure and wishes to proceed. Note:The patient has a small ventral Diaz just above the umbilicus. The mesh seems to be in place in the upper midline, but there may be some fat that has herniated around the left upper quadrant of the mesh. Recommend open repair of the new ventral Diaz with examination of the remainder of the fascia to see if there has been a slight recurrence.   Wilmon ArmsMatthew K. Corliss Skainssuei, MD, [facility]  General/ Trauma Surgery  07/02/2014 11:59 AM

## 2014-07-02 NOTE — Anesthesia Postprocedure Evaluation (Signed)
  Anesthesia Post-op Note  Patient: Rita Diaz  Procedure(s) Performed: Procedure(s) (LRB): HERNIA REPAIR VENTRAL ADULT WITH MESH (N/A) INSERTION OF MESH  Patient Location: PACU  Anesthesia Type: General  Level of Consciousness: awake and alert   Airway and Oxygen Therapy: Patient Spontanous Breathing  Post-op Pain: mild  Post-op Assessment: Post-op Vital signs reviewed, Patient's Cardiovascular Status Stable, Respiratory Function Stable, Patent Airway and No signs of Nausea or vomiting  Last Vitals:  Filed Vitals:   07/02/14 1830  BP: 150/66  Pulse: 59  Temp: 36.4 C  Resp: 14    Post-op Vital Signs: stable   Complications: No apparent anesthesia complications

## 2014-07-02 NOTE — Anesthesia Procedure Notes (Signed)
Procedure Name: Intubation Date/Time: 07/02/2014 2:37 PM Performed by: Leroy LibmanEARDON, Recie Cirrincione L Patient Re-evaluated:Patient Re-evaluated prior to inductionOxygen Delivery Method: Circle system utilized Preoxygenation: Pre-oxygenation with 100% oxygen Intubation Type: IV induction Ventilation: Mask ventilation without difficulty and Oral airway inserted - appropriate to patient size Laryngoscope Size: Hyacinth MeekerMiller and 2 Grade View: Grade I Tube type: Oral Tube size: 7.5 mm Number of attempts: 1 Airway Equipment and Method: Stylet Placement Confirmation: ETT inserted through vocal cords under direct vision,  breath sounds checked- equal and bilateral and positive ETCO2 Secured at: 21 cm Tube secured with: Tape Dental Injury: Teeth and Oropharynx as per pre-operative assessment

## 2014-07-03 ENCOUNTER — Encounter (HOSPITAL_COMMUNITY): Payer: Self-pay | Admitting: Surgery

## 2014-07-03 DIAGNOSIS — K439 Ventral hernia without obstruction or gangrene: Secondary | ICD-10-CM | POA: Diagnosis not present

## 2014-07-03 MED ORDER — LIP MEDEX EX OINT
TOPICAL_OINTMENT | CUTANEOUS | Status: AC
  Administered 2014-07-03: 1
  Filled 2014-07-03: qty 7

## 2014-07-03 NOTE — Progress Notes (Signed)
1 Day Post-Op  Subjective: Patient with moderate nausea.  Pain adequately controlled, but still using some IV pain meds. Has been ambulating She really likes her abdominal binder  Objective: Vital signs in last 24 hours: Temp:  [97.4 F (36.3 C)-98.4 F (36.9 C)] 97.9 F (36.6 C) (12/17 1000) Pulse Rate:  [53-84] 59 (12/17 1000) Resp:  [9-16] 16 (12/17 1000) BP: (104-157)/(60-90) 121/60 mmHg (12/17 1000) SpO2:  [90 %-100 %] 95 % (12/17 1000) Weight:  [191 lb (86.637 kg)] 191 lb (86.637 kg) (12/16 1730) Last BM Date: 07/01/14  Intake/Output from previous day: 12/16 0701 - 12/17 0700 In: 2468.8 [I.V.:2418.8; IV Piggyback:50] Out: 1225 [Urine:1200; Blood:25] Intake/Output this shift: Total I/O In: 420 [P.O.:120; I.V.:300] Out: 600 [Urine:600]  General appearance: alert, cooperative and no distress GI: soft, non-tender; bowel sounds normal; no masses,  no organomegaly Dressing - minimal dry staining; no hematoma; abdominal wall seems flat  Lab Results:   Recent Labs  07/02/14 1833  WBC 7.0  HGB 14.3  HCT 42.5  PLT 221   BMET  Recent Labs  07/02/14 1833  CREATININE 0.81   PT/INR No results for input(s): LABPROT, INR in the last 72 hours. ABG No results for input(s): PHART, HCO3 in the last 72 hours.  Invalid input(s): PCO2, PO2  Studies/Results: No results found.  Anti-infectives: Anti-infectives    Start     Dose/Rate Route Frequency Ordered Stop   07/02/14 2100  ceFAZolin (ANCEF) IVPB 1 g/50 mL premix     1 g100 mL/hr over 30 Minutes Intravenous Every 6 hours 07/02/14 1735 07/03/14 0916   07/02/14 1115  ceFAZolin (ANCEF) IVPB 2 g/50 mL premix     2 g100 mL/hr over 30 Minutes Intravenous On call to O.R. 07/02/14 1115 07/02/14 1440      Assessment/Plan: s/p Procedure(s): HERNIA REPAIR VENTRAL ADULT WITH MESH (N/A) INSERTION OF MESH Plan for discharge tomorrow Encourage PO pain meds today.  Dr. Maisie Fushomas will evaluate her tomorrow and will hopefully be  ready for discharge.  LOS: 1 day    Darlin Stenseth K. 07/03/2014

## 2014-07-03 NOTE — Progress Notes (Signed)
UR completed 

## 2014-07-04 DIAGNOSIS — K439 Ventral hernia without obstruction or gangrene: Secondary | ICD-10-CM | POA: Diagnosis not present

## 2014-07-04 MED ORDER — OXYCODONE-ACETAMINOPHEN 5-325 MG PO TABS: 1.0000 | ORAL_TABLET | ORAL | Status: DC | PRN

## 2014-07-04 NOTE — Progress Notes (Signed)
2 Days Post-Op  Subjective: Patient without nausea and tolerating a diet.  Pain adequately controlled with PO pain meds. Has been ambulating.  Abdominal binder has helped.  Objective: Vital signs in last 24 hours: Temp:  [97.9 F (36.6 C)-98.4 F (36.9 C)] 98.3 F (36.8 C) (12/18 0615) Pulse Rate:  [59-76] 60 (12/18 0615) Resp:  [16] 16 (12/18 0615) BP: (103-135)/(50-62) 135/51 mmHg (12/18 0615) SpO2:  [95 %-97 %] 96 % (12/18 0615) Last BM Date: 07/02/14  Intake/Output from previous day: 12/17 0701 - 12/18 0700 In: 1460 [P.O.:1160; I.V.:300] Out: 2550 [Urine:2550] Intake/Output this shift:    General appearance: alert, cooperative and no distress GI: soft, non-tender; bowel sounds normal; no masses,  no organomegaly Incision- minimal dry staining; no hematoma; abdominal wall seems flat  Lab Results:   Recent Labs  07/02/14 1833  WBC 7.0  HGB 14.3  HCT 42.5  PLT 221   BMET  Recent Labs  07/02/14 1833  CREATININE 0.81   PT/INR No results for input(s): LABPROT, INR in the last 72 hours. ABG No results for input(s): PHART, HCO3 in the last 72 hours.  Invalid input(s): PCO2, PO2  Studies/Results: No results found.  Anti-infectives: Anti-infectives    Start     Dose/Rate Route Frequency Ordered Stop   07/02/14 2100  ceFAZolin (ANCEF) IVPB 1 g/50 mL premix     1 g100 mL/hr over 30 Minutes Intravenous Every 6 hours 07/02/14 1735 07/03/14 0916   07/02/14 1115  ceFAZolin (ANCEF) IVPB 2 g/50 mL premix     2 g100 mL/hr over 30 Minutes Intravenous On call to O.R. 07/02/14 1115 07/02/14 1440      Assessment/Plan: s/p Procedure(s): HERNIA REPAIR VENTRAL ADULT WITH MESH (N/A) INSERTION OF MESH Ready for discharge today   Vanita PandaHOMAS, Georgie Haque C. 07/04/2014

## 2014-07-04 NOTE — Discharge Instructions (Signed)
[facility]      [facility] [phone]  OPEN ABDOMINAL SURGERY: POST OP INSTRUCTIONS  Always review your discharge instruction sheet given to you by the facility where your surgery was performed.  IF YOU HAVE DISABILITY OR FAMILY LEAVE FORMS, YOU MUST BRING THEM TO THE OFFICE FOR PROCESSING.  PLEASE DO NOT GIVE THEM TO YOUR DOCTOR.  1. A prescription for pain medication may be given to you upon discharge.  Take your pain medication as prescribed, if needed.  If narcotic pain medicine is not needed, then you may take acetaminophen (Tylenol) or ibuprofen (Advil) as needed. 2. Take your usually prescribed medications unless otherwise directed. 3. If you need a refill on your pain medication, please contact your pharmacy. They will contact our office to request authorization.  Prescriptions will not be filled after 5pm or on week-ends. 4. You should follow a light diet the first few days after arrival home, such as soup and crackers, pudding, etc.unless your doctor has advised otherwise. A high-fiber, low fat diet can be resumed as tolerated.   Be sure to include lots of fluids daily. Most patients will experience some swelling and bruising on the chest and neck area.  Ice packs will help.  Swelling and bruising can take several days to resolve 5. Most patients will experience some swelling and bruising in the area of the incision. Ice pack will help. Swelling and bruising can take several days to resolve..  6. It is common to experience some constipation if taking pain medication after surgery.  Increasing fluid intake and taking a stool softener will usually help or prevent this problem from occurring.  A mild laxative (Milk of Magnesia or Miralax) should be taken according to package directions if there are no bowel movements after 48 hours. 7.  You may have steri-strips (small skin tapes) in place directly over the incision.  These strips should be left on the skin for 7-10 days.  If your  surgeon used skin glue on the incision, you may shower in 24 hours.  The glue will flake off over the next 2-3 weeks.  Any sutures or staples will be removed at the office during your follow-up visit. You may find that a light gauze bandage over your incision may keep your staples from being rubbed or pulled. You may shower and replace the bandage daily. 8. ACTIVITIES:  You may resume regular (light) daily activities beginning the next day--such as daily self-care, walking, climbing stairs--gradually increasing activities as tolerated.  You may have sexual intercourse when it is comfortable.  Refrain from any heavy lifting or straining until approved by your doctor. a. You may drive when you no longer are taking prescription pain medication, you can comfortably wear a seatbelt, and you can safely maneuver your car and apply brakes b. Return to Work: ___________________________________ 9. You should see your doctor in the office for a follow-up appointment approximately two weeks after your surgery.  Make sure that you call for this appointment within a day or two after you arrive home to insure a convenient appointment time. OTHER INSTRUCTIONS:  _____________________________________________________________ _____________________________________________________________  WHEN TO CALL YOUR DOCTOR: 1. Fever over 101.0 2. Inability to urinate 3. Nausea and/or vomiting 4. Extreme swelling or bruising 5. Continued bleeding from incision. 6. Increased pain, redness, or drainage from the incision. 7. Difficulty swallowing or breathing 8. Muscle cramping or spasms. 9. Numbness or tingling in hands or feet or around lips.  The clinic staff is available to   answer your questions during regular business hours.  Please don't hesitate to call and ask to speak to one of the nurses if you have concerns.  For further questions, please visit [email or web address]   

## 2014-07-05 ENCOUNTER — Telehealth (INDEPENDENT_AMBULATORY_CARE_PROVIDER_SITE_OTHER): Payer: Self-pay | Admitting: General Surgery

## 2014-07-05 ENCOUNTER — Other Ambulatory Visit (INDEPENDENT_AMBULATORY_CARE_PROVIDER_SITE_OTHER): Payer: Self-pay | Admitting: General Surgery

## 2014-07-05 MED ORDER — ONDANSETRON HCL 4 MG PO TABS: 4.0000 mg | ORAL_TABLET | ORAL | Status: DC | PRN

## 2014-07-05 NOTE — Telephone Encounter (Signed)
She underwent laparoscopic repair of a ventral hernia 3 days ago and was discharged yesterday. She stated she had some nausea in the hospital which was treated with an antibiotic. After lunch today she began having nausea and vomiting. She is throwing up just clear fluid. She has not eaten much today. She is moving her bowels. No fever. She does not feel distended. I called her in some Zofran and recommended she take sips of clear liquids and ice chips for now. If the problem persists I told her she should call us back.

## 2014-07-14 ENCOUNTER — Encounter: Payer: Self-pay | Admitting: Family Medicine

## 2014-07-14 ENCOUNTER — Ambulatory Visit (INDEPENDENT_AMBULATORY_CARE_PROVIDER_SITE_OTHER): Payer: BC Managed Care – PPO | Admitting: Family Medicine

## 2014-07-14 VITALS — BP 148/96 | HR 94 | Temp 97.9°F | Ht 61.0 in | Wt 186.0 lb

## 2014-07-14 DIAGNOSIS — Z9889 Other specified postprocedural states: Secondary | ICD-10-CM

## 2014-07-14 DIAGNOSIS — L918 Other hypertrophic disorders of the skin: Secondary | ICD-10-CM

## 2014-07-14 DIAGNOSIS — R609 Edema, unspecified: Secondary | ICD-10-CM

## 2014-07-14 DIAGNOSIS — Z8719 Personal history of other diseases of the digestive system: Secondary | ICD-10-CM

## 2014-07-14 NOTE — Patient Instructions (Signed)
Continue to watch sodium intake and avoid anti-inflammatory medicines as much as possible as these increased the risk of edema Continue to follow-up with the surgeon who did your hernia repair The skin tag cryotherapy below the right eye may form a blister and with a blister ruptures the skin tag should fall off. If there is any problem with this please return to the clinic for us to recheck it. If the lesion does not resolve completely with may need to get a dermatologist to see this.

## 2014-07-14 NOTE — Progress Notes (Signed)
Subjective:    Patient ID: Rita Diaz, female    DOB: April 11, 1956, 58 y.o.   MRN: [id number]  HPI Patient here today for 6 week follow up on abdominal pain and edema. She had hernia surgery on 07/02/14. She is doing much better with the edema and only takes the Lasix as needed. She also complains of a necrotic skin tag below the right orbit underneath her glasses she goes back to see the surgeon who did her hernia repair at the end of this month. For some reason the patient is somewhat tearful today and she did not have a reason for this. She did have a brief intestinal virus following the recent hernia repair and this is improved.        Patient Active Problem List   Diagnosis Date Noted  . Ventral hernia without obstruction or gangrene 07/02/2014  . Recurrent ventral hernia 05/27/2013  . Kidney stone 12/06/2012  . S/P repair of recurrent ventral hernia 08/27/2012  . Abdominal pain, chronic, left upper quadrant 08/27/2012   Outpatient Encounter Prescriptions as of 07/14/2014  Medication Sig  . ALPRAZolam (XANAX) 0.5 MG tablet Take 1 tablet (0.5 mg total) by mouth 3 (three) times daily as needed for anxiety.  . ondansetron (ZOFRAN) 4 MG tablet Take 1 tablet (4 mg total) by mouth every 4 (four) hours as needed for nausea.  Marland Kitchen. oxyCODONE-acetaminophen (PERCOCET/ROXICET) 5-325 MG per tablet Take 1-2 tablets by mouth every 4 (four) hours as needed for moderate pain.  . [DISCONTINUED] furosemide (LASIX) 20 MG tablet One po qd 2 - 3 days prn swelling prn severe swelling (Patient taking differently: Take 20 mg by mouth daily as needed for fluid. One po qd 2 - 3 days prn swelling prn severe swelling)    Review of Systems  Constitutional: Negative.   HENT: Negative.   Eyes: Negative.   Respiratory: Negative.   Cardiovascular: Negative.   Gastrointestinal: Negative.   Endocrine: Negative.   Genitourinary: Negative.   Musculoskeletal: Negative.   Skin: Negative.   Allergic/Immunologic:  Negative.   Neurological: Negative.   Hematological: Negative.   Psychiatric/Behavioral: Negative.        Objective:   Physical Exam  Constitutional: She is oriented to person, place, and time. She appears well-developed and well-nourished. No distress.  HENT:  Head: Normocephalic.  Eyes: Conjunctivae and EOM are normal. Pupils are equal, round, and reactive to light. Right eye exhibits no discharge. Left eye exhibits no discharge. No scleral icterus.  Cardiovascular: Normal rate, regular rhythm and normal heart sounds.   No murmur heard. Pulmonary/Chest: Effort normal and breath sounds normal. No respiratory distress. She has no wheezes. She has no rales.  Abdominal:  Hernia incision appears to be healing well with no drainage or erythema  Musculoskeletal: Normal range of motion.  Neurological: She is alert and oriented to person, place, and time.  Skin: Skin is warm and dry. No rash noted. No erythema. No pallor.  There is a necrotic appearing skin tag below the right orbit. Cryotherapy was done on this lesion without complication  Psychiatric: She has a normal mood and affect. Her behavior is normal. Judgment and thought content normal.  Vitals reviewed.  BP 148/96 mmHg  Pulse 94  Temp(Src) 97.9 F (36.6 C) (Oral)  Ht 5\' 1"  (1.549 m)  Wt 186 lb (84.369 kg)  BMI 35.16 kg/m2        Assessment & Plan:  1. Edema  2. Skin tag, necrotic -Cryotherapy done without complication  3. Status post repair of ventral hernia  Patient Instructions  Continue to watch sodium intake and avoid anti-inflammatory medicines as much as possible as these increased the risk of edema Continue to follow-up with the surgeon who did your hernia repair The skin tag cryotherapy below the right eye may form a blister and with a blister ruptures the skin tag should fall off. If there is any problem with this please return to the clinic for us to recheck it. If the lesion does not resolve completely  with may need to get a dermatologist to see this.   Nyra Capeson W. Lana Flaim MD

## 2014-07-15 NOTE — Discharge Summary (Signed)
Physician Discharge Summary  Patient ID: Rita HalimKathy T Diaz MRN: [id number] DOB/AGE: 25-May-1956 58 y.o.  Admit date: 07/02/2014 Discharge date: 07/04/14   Admission Diagnoses:Ventral hernia  Discharge Diagnoses: Ventral hernia Active Problems:   Ventral hernia without obstruction or gangrene   Discharged Condition: good  Hospital Course: Open repair of small supraumbilical ventral hernia with mesh; also visual examination of the fascia of the previous epigastric ventral hernia repair with mesh  Consults: None  Significant Diagnostic Studies: none  Treatments: surgery: see above  Discharge Exam: Blood pressure 135/51, pulse 60, temperature 98.3 F (36.8 C), temperature source Oral, resp. rate 16, height 5\' 1"  (1.549 m), weight 191 lb (86.637 kg), SpO2 96 %. General appearance: alert, cooperative and no distress GI: soft, incisional tenderness; incision c/d/i  Disposition: 01-Home or Self Care     Medication List    STOP taking these medications        furosemide 20 MG tablet  Commonly known as:  LASIX      TAKE these medications        ALPRAZolam 0.5 MG tablet  Commonly known as:  XANAX  Take 1 tablet (0.5 mg total) by mouth 3 (three) times daily as needed for anxiety.     oxyCODONE-acetaminophen 5-325 MG per tablet  Commonly known as:  PERCOCET/ROXICET  Take 1-2 tablets by mouth every 4 (four) hours as needed for moderate pain.           Follow-up Information    Follow up with Wynona LunaSUEI,Luca Dyar K., MD In 2 weeks.   Specialty:  General Surgery   Contact information:   [address] [place] [address] [phone]       Signed: Wynona LunaSUEI,Aditya Nastasi K. 07/15/2014, 7:57 AM

## 2014-07-22 ENCOUNTER — Other Ambulatory Visit: Payer: Self-pay | Admitting: Family Medicine

## 2014-07-22 NOTE — Telephone Encounter (Signed)
Last seen 07/14/14 DWM   If approved [facility]

## 2014-08-27 ENCOUNTER — Encounter: Payer: Self-pay | Admitting: *Deleted

## 2014-08-27 ENCOUNTER — Telehealth: Payer: Self-pay | Admitting: Family Medicine

## 2014-08-27 MED ORDER — AMOXICILLIN 500 MG PO CAPS: 500.0000 mg | ORAL_CAPSULE | Freq: Three times a day (TID) | ORAL | Status: DC

## 2014-08-27 NOTE — Telephone Encounter (Signed)
Amoxicillin 500  3 times a day for 10 days and use nasal  saline

## 2014-08-27 NOTE — Telephone Encounter (Signed)
Please make sure this prescription gets called in for this patient

## 2014-08-27 NOTE — Telephone Encounter (Signed)
Left detailed message stating antibiotic sent over to pharmacy and to use saline rinse. If any further questions or concerns to CB.

## 2014-09-24 ENCOUNTER — Telehealth: Payer: Self-pay | Admitting: Cardiology

## 2014-09-24 NOTE — Telephone Encounter (Signed)
Patient had surgery that corrected her fluid issue

## 2014-12-01 ENCOUNTER — Telehealth: Payer: Self-pay | Admitting: Family Medicine

## 2014-12-01 NOTE — Telephone Encounter (Signed)
Patient advised she will need to be seen if she is not getting any results with the miralax, stool softner, prune juice.

## 2014-12-16 ENCOUNTER — Telehealth: Payer: Self-pay | Admitting: Family Medicine

## 2014-12-18 NOTE — Telephone Encounter (Signed)
Patient aware that she needs to contact the surgeon since he is the one that has her out on light duty.

## 2014-12-30 ENCOUNTER — Encounter (INDEPENDENT_AMBULATORY_CARE_PROVIDER_SITE_OTHER): Payer: Self-pay

## 2014-12-30 ENCOUNTER — Ambulatory Visit (INDEPENDENT_AMBULATORY_CARE_PROVIDER_SITE_OTHER): Payer: BC Managed Care – PPO | Admitting: Nurse Practitioner

## 2014-12-30 ENCOUNTER — Encounter: Payer: Self-pay | Admitting: Nurse Practitioner

## 2014-12-30 ENCOUNTER — Ambulatory Visit (HOSPITAL_COMMUNITY)
Admission: RE | Admit: 2014-12-30 | Discharge: 2014-12-30 | Disposition: A | Discharge status: Home / Self Care | Payer: BC Managed Care – PPO | Source: Ambulatory Visit | Attending: Nurse Practitioner | Admitting: Nurse Practitioner

## 2014-12-30 VITALS — BP 141/79 | HR 80 | Temp 97.8°F | Ht 64.0 in | Wt 181.0 lb

## 2014-12-30 DIAGNOSIS — K219 Gastro-esophageal reflux disease without esophagitis: Secondary | ICD-10-CM

## 2014-12-30 DIAGNOSIS — K5900 Constipation, unspecified: Secondary | ICD-10-CM | POA: Insufficient documentation

## 2014-12-30 DIAGNOSIS — R109 Unspecified abdominal pain: Secondary | ICD-10-CM | POA: Insufficient documentation

## 2014-12-30 MED ORDER — LUBIPROSTONE 24 MCG PO CAPS: 24.0000 ug | ORAL_CAPSULE | Freq: Two times a day (BID) | ORAL | Status: DC

## 2014-12-30 NOTE — Assessment & Plan Note (Signed)
Patient is status post ventral hernia repair and on narcotic pain medications with noted constipation. She'll have a bowel movement every 2-3 weeks. Her bowel movements are typically consistent of hard pieces followed by loose stools. Likely slow transit, opioid-induced constipation. Has tried and failed MiraLAX as well as stool softeners. Unlikely intestinal obstruction due to very minimal pain which is incisional in nature as well as no nausea or vomiting. We'll order a flat plate abdomen to assess for stool burden. We'll have her stop MiraLAX and start her on Amitiza 24 g twice a day to be taken with food to prevent side effects of nausea. We will provide her samples for 2 weeks and she has agreed to call us in that time frame and notify us of the effectiveness of the medication. We'll also reassess in a two-month follow-up appointment.

## 2014-12-30 NOTE — Progress Notes (Signed)
Primary Care Physician:  Rudi Heap, MD Primary Gastroenterologist:  Dr. Jena Gauss  Chief Complaint  Patient presents with  . Abdominal Pain  . Constipation    HPI:   59 year old female presents on referral from surgeon status post ventral hernia. Last postop visit 10/27/2014 noted that the patient try to go back to work but felt a popping while lifting a mild bucket and she was held out of work for an additional month severe pain associated with this has since resolved. Patient is lost 20 pounds in surgery through left stomach medication. Noted the incision is clean dry and intact, well healed. No sign of recurrent hernia, no abdominal tenderness. Patient was referred to [name] for constipation, decreased stomach motility, abdominal pain. Endoscopy completed in [place] on 02/24/2014 found Nissen fundoplication with healthy-appearing mucosa, normal stomach, normal duodenum, no specimens collected.  Today she states she's been doing well. Is having some abdominal pain at the left side of the incision which the surgeon feels is from where he had to replace a fluid pocket during surgery. Patient is having a bowel movement about once every 2-3 weeks, which is alternating between small, hard pieces and loose stool. Denies hematochezia and melena. She typically will have crampy  Abdominal pain, fecal urgency and she has to run to the bathroom. Has tried Miralax twice daily and stool softeners OTC without relief. Is still on narcotic pain medication. Deneis N/V. Has been having some minimal GERD symtpoms but states she's been taking OTC Advil for pain when she doesn't want to take the narcotic pain meds, otherwise her GERD is well controlled. Denies chest pain, dyspnea, dizziness, lightheadedness, syncope, near syncope. Denies any other upper or lower GI symptoms.  Past Medical History  Diagnosis Date  . GERD (gastroesophageal reflux disease)     occ  . H/O hiatal hernia   . History of kidney  stones   . Anxiety   . Ventral hernia   . Swelling     L SIDE OF BODY    Past Surgical History  Procedure Laterality Date  . Abdominal hysterectomy  1989  . Abdominal hernia repair  1996, 2008  . Laparoscopy      x3  . Cesarean section  1985  . Bladder suspension    . Cholecystectomy    . Hernia repair      x 2  . Ventral hernia repair  09/19/2012    Dr Corliss Skains  . Laparoscopic lysis of adhesions N/A 09/19/2012    Procedure: LAPAROSCOPIC LYSIS OF ADHESIONS;  Surgeon: Wilmon Arms. Tsuei, MD;  Location: [facility] OR;  Service: General;  Laterality: N/A;  . Cystoscopy with retrograde pyelogram, ureteroscopy and stent placement Left 02/09/2013    Procedure: CYSTOSCOPY WITH RETROGRADE PYELOGRAM, DIAGNOSTIC URETEROSCOPY AND STENT PLACEMENT ;  Surgeon: Sebastian Ache, MD;  Location: [facility];  Service: Urology;  Laterality: Left;  Marland Kitchen Ventral hernia repair N/A 07/02/2014    Procedure: HERNIA REPAIR VENTRAL ADULT WITH MESH;  Surgeon: Manus Rudd, MD;  Location: [facility] ORS;  Service: General;  Laterality: N/A;  . Insertion of mesh  07/02/2014    Procedure: INSERTION OF MESH;  Surgeon: Manus Rudd, MD;  Location: [facility] ORS;  Service: General;;    Current Outpatient Prescriptions  Medication Sig Dispense Refill  . oxyCODONE-acetaminophen (PERCOCET/ROXICET) 5-325 MG per tablet Take 1-2 tablets by mouth every 4 (four) hours as needed for moderate pain. 30 tablet 0  . ALPRAZolam (XANAX) 0.5 MG tablet TAKE ONE TABLET BY MOUTH THREE  TIMES DAILY AS NEEDED (Patient not taking: Reported on 12/30/2014) 90 tablet 1  . amoxicillin (AMOXIL) 500 MG capsule Take 1 capsule (500 mg total) by mouth 3 (three) times daily. (Patient not taking: Reported on 12/30/2014) 30 capsule 0  . ondansetron (ZOFRAN) 4 MG tablet Take 1 tablet (4 mg total) by mouth every 4 (four) hours as needed for nausea. (Patient not taking: Reported on 12/30/2014) 30 tablet 0   No current facility-administered medications for this visit.    Allergies as of  12/30/2014 - Review Complete 12/30/2014  Allergen Reaction Noted  . Aspirin Other (See Comments) 08/27/2012    Family History  Problem Relation Age of Onset  . Other Mother 47    nash  . Cancer Father 35    lung Cancer    History   Social History  . Marital Status: Married    Spouse Name: N/A  . Number of Children: N/A  . Years of Education: N/A   Occupational History  . Not on file.   Social History Main Topics  . Smoking status: Never Smoker   . Smokeless tobacco: Never Used  . Alcohol Use: No  . Drug Use: No  . Sexual Activity: Not on file   Other Topics Concern  . Not on file   Social History Narrative    Review of Systems: General: Negative for anorexia, weight loss, fever, chills, fatigue, weakness. ENT: Negative for hoarseness, difficulty swallowing. CV: Negative for chest pain, angina, palpitations, peripheral edema.  Respiratory: Negative for dyspnea at rest, cough, wheezing.  GI: See history of present illness. Derm: Negative for rash or itching.  Neuro: Negative for weakness, memory loss, confusion.  Endo: Negative for unusual weight change.  Heme: Negative for bleeding. Allergy: Negative for rash or hives.    Physical Exam: BP 141/79 mmHg  Pulse 80  Temp(Src) 97.8 F (36.6 C) (Oral)  Ht 5\' 4"  (1.626 m)  Wt 181 lb (82.101 kg)  BMI 31.05 kg/m2 General:   Alert and oriented. Pleasant and cooperative. Well-nourished and well-developed.  Head:  Normocephalic and atraumatic. Eyes:  Without icterus, sclera clear and conjunctiva pink.  Ears:  Normal auditory acuity. Cardiovascular:  S1, S2 present without murmurs appreciated. Normal pulses noted. Extremities without clubbing or edema. Respiratory:  Clear to auscultation bilaterally. No wheezes, rales, or rhonchi. No distress.  Gastrointestinal:  +BS, soft, and non-distended. Mild incisional tenderness otherwise non-tender. No HSM noted. No guarding or rebound. No masses appreciated.  Rectal:   Deferred  Skin:  Intact without significant lesions or rashes. Neurologic:  Alert and oriented x4;  grossly normal neurologically. Psych:  Alert and cooperative. Normal mood and affect. Heme/Lymph/Immune: No excessive bruising noted.    12/30/2014 1:48 PM

## 2014-12-30 NOTE — Patient Instructions (Signed)
1. Have your x-ray done at [facility] within the next couple days. Next line we will give the samples of Amitiza, 24 g. Take 1 pill twice daily with food. Call us within 2 weeks with the results. 2. Return for further evaluation in 2 months.

## 2014-12-30 NOTE — Assessment & Plan Note (Signed)
Patient with a history of GERD which is generally well-controlled with diet. She has had some minor increase in her GERD symptoms but upon questioning states that while trying to come off of her narcotic pain medication she's been taking over-the-counter Advil. Advised her to not take any NSAIDs due to her history of GERD as well as recent surgical procedure. We'll further evaluate at her next follow-up appointment in 2 months.

## 2014-12-31 NOTE — Progress Notes (Signed)
cc'd to pcp 

## 2015-01-07 ENCOUNTER — Telehealth: Payer: Self-pay | Admitting: *Deleted

## 2015-01-07 NOTE — Telephone Encounter (Signed)
Tried to call pt- NA- LMOM with results. Ginger has already mailed a letter to the pt because she was unable to get in touch with her.

## 2015-01-07 NOTE — Telephone Encounter (Signed)
Pt called to get her results. Please advise  

## 2015-02-18 ENCOUNTER — Other Ambulatory Visit: Payer: Self-pay | Admitting: Family Medicine

## 2015-02-19 NOTE — Telephone Encounter (Signed)
Last seen 07/14/14 DWM   If approved route to nurse to call into [facility] 

## 2015-02-24 ENCOUNTER — Ambulatory Visit: Payer: BC Managed Care – PPO | Admitting: Nurse Practitioner

## 2015-03-04 ENCOUNTER — Telehealth: Payer: Self-pay | Admitting: Family Medicine

## 2015-03-05 ENCOUNTER — Ambulatory Visit (INDEPENDENT_AMBULATORY_CARE_PROVIDER_SITE_OTHER): Payer: BC Managed Care – PPO | Admitting: Family Medicine

## 2015-03-05 ENCOUNTER — Encounter: Payer: Self-pay | Admitting: Family Medicine

## 2015-03-05 VITALS — BP 135/80 | HR 62 | Temp 97.4°F | Ht 64.0 in | Wt 181.0 lb

## 2015-03-05 DIAGNOSIS — R110 Nausea: Secondary | ICD-10-CM | POA: Diagnosis not present

## 2015-03-05 MED ORDER — ONDANSETRON HCL 4 MG PO TABS: 4.0000 mg | ORAL_TABLET | ORAL | Status: DC | PRN

## 2015-03-05 MED ORDER — RANITIDINE HCL 150 MG PO TABS: 150.0000 mg | ORAL_TABLET | Freq: Two times a day (BID) | ORAL | Status: DC

## 2015-03-05 NOTE — Progress Notes (Signed)
BP 135/80 mmHg  Pulse 62  Temp(Src) 97.4 F (36.3 C) (Oral)  Ht 5\' 4"  (1.626 m)  Wt 181 lb (82.101 kg)  BMI 31.05 kg/m2   Subjective:    Patient ID: Rita Diaz, female    DOB: 1955-11-05, 59 y.o.   MRN: [id number]  HPI: Rita Diaz is a 59 y.o. female presenting on 03/05/2015 for Nausea   HPI Nausea Patient presents with nausea and some vomiting. Patient has been having this nausea for the past 3 weeks during this episode, she has had previous issues with this with her recurrent ventral hernias. She has had at least 2 repairs on those previously. She has already called her surgeon and set up an appointment for early September. Patient has only had vomiting once or twice and that was early on 3 weeks ago. She has been using Zofran since that time to help control the nausea. She cannot feel any palpable bulge like she had previously. This was most recently repaired in December 2015. She denies any fevers or chills, he denies any abdominal pain, just more discomfort. She does have some occasional burning that goes up into her chest. Denies any association with food.  Relevant past medical, surgical, family and social history reviewed and updated as indicated. Interim medical history since our last visit reviewed. Allergies and medications reviewed and updated.  Review of Systems  Constitutional: Negative for fever and chills.  HENT: Negative for congestion, ear discharge and ear pain.   Eyes: Negative for redness and visual disturbance.  Respiratory: Negative for chest tightness and shortness of breath.   Cardiovascular: Negative for chest pain and leg swelling.  Gastrointestinal: Positive for nausea and vomiting. Negative for abdominal pain, diarrhea and constipation.  Genitourinary: Negative for dysuria and difficulty urinating.  Musculoskeletal: Negative for back pain and gait problem.  Skin: Negative for rash.  Neurological: Negative for light-headedness and headaches.    Psychiatric/Behavioral: Negative for behavioral problems and agitation.  All other systems reviewed and are negative.   Per HPI unless specifically indicated above     Medication List       This list is accurate as of: 03/05/15  8:28 PM.  Always use your most recent med list.               ALPRAZolam 0.5 MG tablet  Commonly known as:  XANAX  TAKE ONE TABLET BY MOUTH THREE TIMES DAILY AS NEEDED     ondansetron 4 MG tablet  Commonly known as:  ZOFRAN  Take 1 tablet (4 mg total) by mouth every 4 (four) hours as needed for nausea or vomiting.     oxyCODONE-acetaminophen 5-325 MG per tablet  Commonly known as:  PERCOCET/ROXICET  Take 1-2 tablets by mouth every 4 (four) hours as needed for moderate pain.     ranitidine 150 MG tablet  Commonly known as:  ZANTAC  Take 1 tablet (150 mg total) by mouth 2 (two) times daily.           Objective:    BP 135/80 mmHg  Pulse 62  Temp(Src) 97.4 F (36.3 C) (Oral)  Ht 5\' 4"  (1.626 m)  Wt 181 lb (82.101 kg)  BMI 31.05 kg/m2  Wt Readings from Last 3 Encounters:  03/05/15 181 lb (82.101 kg)  12/30/14 181 lb (82.101 kg)  07/14/14 186 lb (84.369 kg)    Physical Exam  Constitutional: She is oriented to person, place, and time. She appears well-developed and well-nourished. No distress.  Eyes: Conjunctivae and EOM are normal.  Cardiovascular: Normal rate and regular rhythm.   No murmur heard. Pulmonary/Chest: Effort normal and breath sounds normal. No respiratory distress. She has no wheezes.  Abdominal: Soft. Bowel sounds are normal. She exhibits no distension. There is tenderness (epigastric). There is no rebound and no guarding.  Vertical midline incision superior to the umbilicus. No palpable hernia but does have significant scar tissue.  Musculoskeletal: Normal range of motion. She exhibits no edema or tenderness.  Neurological: She is alert and oriented to person, place, and time. Coordination normal.  Skin: Skin is warm and  dry. No rash noted. She is not diaphoretic.  Psychiatric: She has a normal mood and affect. Her behavior is normal.  Vitals reviewed.   Results for orders placed or performed during the hospital encounter of 07/02/14  CBC  Result Value Ref Range   WBC 7.0 4.0 - 10.5 K/uL   RBC 4.89 3.87 - 5.11 MIL/uL   Hemoglobin 14.3 12.0 - 15.0 g/dL   HCT 56.2 13.0 - 86.5 %   MCV 86.9 78.0 - 100.0 fL   MCH 29.2 26.0 - 34.0 pg   MCHC 33.6 30.0 - 36.0 g/dL   RDW 78.4 69.6 - 29.5 %   Platelets 221 150 - 400 K/uL  Creatinine, serum  Result Value Ref Range   Creatinine, Ser 0.81 0.50 - 1.10 mg/dL   GFR calc non Af Amer 79 (L) >90 mL/min   GFR calc Af Amer >90 >90 mL/min      Assessment & Plan:   Problem List Items Addressed This Visit    None    Visit Diagnoses    Nausea without vomiting    -  Primary    Patient has this recurrent nausea with minimal vomiting, will send refill for Zofran and recommended to take Zantac for at least the next month. Possibly GERD        Follow up plan: Return if symptoms worsen or fail to improve.  Arville Care, MD [facility] 03/05/2015, 4:07 PM

## 2015-04-17 ENCOUNTER — Other Ambulatory Visit: Payer: Self-pay | Admitting: Family Medicine

## 2015-04-17 ENCOUNTER — Other Ambulatory Visit: Payer: Self-pay | Admitting: Surgery

## 2015-04-17 DIAGNOSIS — Z8719 Personal history of other diseases of the digestive system: Secondary | ICD-10-CM

## 2015-04-17 DIAGNOSIS — Z9889 Other specified postprocedural states: Secondary | ICD-10-CM

## 2015-04-17 MED ORDER — ALPRAZOLAM 0.5 MG PO TABS: 0.5000 mg | ORAL_TABLET | Freq: Three times a day (TID) | ORAL | Status: DC | PRN

## 2015-04-17 NOTE — Telephone Encounter (Signed)
I will send refill this once. If I am covering for Dr. Christell Constant and he can continue the medication but if she is my patient now than she needs to see me and discuss this with me in a visit before next refill.

## 2015-04-17 NOTE — Telephone Encounter (Signed)
Refill on xanax called to [facility].  Message left for patient. Must schedule appointment to discuss further refills.

## 2015-04-24 ENCOUNTER — Other Ambulatory Visit: Payer: BC Managed Care – PPO

## 2015-04-29 ENCOUNTER — Other Ambulatory Visit: Payer: Self-pay | Admitting: Surgery

## 2015-04-29 ENCOUNTER — Ambulatory Visit
Admission: RE | Admit: 2015-04-29 | Discharge: 2015-04-29 | Disposition: A | Discharge status: Home / Self Care | Payer: BC Managed Care – PPO | Source: Ambulatory Visit | Attending: Surgery | Admitting: Surgery

## 2015-04-29 DIAGNOSIS — Z8719 Personal history of other diseases of the digestive system: Secondary | ICD-10-CM

## 2015-04-29 DIAGNOSIS — Z9889 Other specified postprocedural states: Secondary | ICD-10-CM

## 2015-05-15 ENCOUNTER — Ambulatory Visit
Admission: RE | Admit: 2015-05-15 | Discharge: 2015-05-15 | Disposition: A | Discharge status: Home / Self Care | Payer: BC Managed Care – PPO | Source: Ambulatory Visit | Attending: Surgery | Admitting: Surgery

## 2015-05-15 DIAGNOSIS — Z9889 Other specified postprocedural states: Secondary | ICD-10-CM

## 2015-05-15 DIAGNOSIS — Z8719 Personal history of other diseases of the digestive system: Secondary | ICD-10-CM

## 2015-05-15 MED ORDER — IOPAMIDOL (ISOVUE-300) INJECTION 61%: 100.0000 mL | Freq: Once | INTRAVENOUS | Status: DC | PRN

## 2015-05-15 NOTE — Progress Notes (Signed)
Quick Note:  Please call the patient and let them know that the scan did not show any sign of recurrent hernia. I can see her next week to discuss this fluid collection in front of the mesh   ______

## 2015-05-18 ENCOUNTER — Ambulatory Visit: Payer: Self-pay | Admitting: Surgery

## 2015-05-18 NOTE — H&P (Signed)
History of Present Illness (Damek Ende K. Earleen Aoun MD; 05/18/2015 2:10 PM) The patient is a 59 year old female who presents with non-malignant abdominal pain. s/p open ventral hernia repair with mesh on 07/02/14. She was last seen in May. She is back at work now. She has been seeing GI for chronic constipation. She has noticed some firmness in the middle of her incision, but not bulging. No obstructive symptoms. Her pain is fairly well controlled. She would like to have a check to make sure she doesn't have a recurrence. She underwent a CT scan recently that showed no problems with her hernia repair, but she does have an area of scar tissue vs. fat necrosis in her epigastrium, corresponding to the area of tenderness.  CLINICAL DATA: Patient with ventral abdominal wall pain, evaluate for recurrent ventral hernia superior to the umbilicus.  EXAM: CT ABDOMEN AND PELVIS WITH CONTRAST  TECHNIQUE: Multidetector CT imaging of the abdomen and pelvis was performed using the standard protocol following bolus administration of intravenous contrast.  CONTRAST: 100 cc Omnipaque 300  COMPARISON: CT abdomen pelvis 06/11/2014  FINDINGS: Lower chest: Normal heart size. Dependent atelectasis within the bilateral lower lobes. No pleural effusion. Stable 4 mm right middle lobe pulmonary nodule.  Hepatobiliary: Normal size and contour of the liver. No focal hepatic lesion identified. Patient status post cholecystectomy. Hepatic and portal veins are patent.  Pancreas: Unremarkable  Spleen: Unremarkable  Adrenals/Urinary Tract: Normal adrenal glands. Kidneys enhance symmetrically with contrast. Urinary bladder is decompressed.  Stomach/Bowel: No abnormal bowel wall thickening or evidence for bowel obstruction. No free fluid or free intraperitoneal air.  Vascular/Lymphatic: Normal caliber abdominal aorta. No retroperitoneal lymphadenopathy.  Other: Patient status post  hysterectomy.  Musculoskeletal: Lumbar spine degenerative changes. No aggressive or acute appearing osseous lesions. Within the ventral abdominal wall midline there is a 2.7 x 2.0 cm low-attenuation collection with peripheral irregular enhancement. This is within the subcutaneous fat overlying the area of previous ventral abdominal wall hernia repair (image 40; series 3). No evidence for recurrent hernia.  IMPRESSION: Central low-attenuation collection within the subcutaneous fat overlying the area ventral abdominal wall hernia repair is nonspecific however may represent postoperative seroma and/or fat necrosis. Superimposed infectious process is not excluded. Recommend clinical correlation.  No CT evidence to suggest recurrent ventral abdominal wall hernia.   Electronically Signed By: Drew Davis M.D. On: 05/15/2015 10:09     Allergies (Ashley Beck, CMA; 05/18/2015 11:46 AM) Aspirin EC Lo-Dose *ANALGESICS - NonNarcotic*  Medication History (Ashley Beck, CMA; 05/18/2015 11:46 AM) Percocet (5-325MG Tablet, 1 (one) Tablet Oral every four hours, as needed, Taken starting 12/29/2014) Active. Nexium 24HR (20MG Capsule DR, 1 (one) Capsule DR Oral daily, Taken starting 07/23/2014) Active. Furosemide (20MG Tablet, Oral qd) Active. ALPRAZolam (0.5MG Tablet, Oral prn) Active. Medications Reconciled    Vitals (Ashley Beck CMA; 05/18/2015 11:46 AM) 05/18/2015 11:46 AM Weight: 128 lb Height: 64in Body Surface Area: 1.62 m Body Mass Index: 21.97 kg/m  Temp.: 98F  Pulse: 80 (Regular)  BP: 128/68 (Sitting, Left Arm, Standard)      Physical Exam (Embry Huss K. Kerri Asche MD; 05/18/2015 2:11 PM)  The physical exam findings are as follows: Note:WDWN in NAD HEENT: EOMI, sclera anicteric Neck: No masses, no thyromegaly Lungs: CTA bilaterally; normal respiratory effort CV: Regular rate and rhythm; no murmurs Abd: +bowel sounds, soft, no sign of recurrent ventral  hernia; healed incision In the epigastrium about 4 cm above the umbilicus, there is a 3 cm firm tender mass in the subcutaneous   tissues. This corresponds with the area seen on the CT scan Ext: Well-perfused; no edema Skin: Warm, dry; no sign of jaundice    Assessment & Plan Wilmon Arms(Dwight Adamczak K. Nyiesha Beever MD; 05/18/2015 2:11 PM)  EPIGASTRIC MASS (R19.06)  Current Plans Schedule for surgery - excision of abdominal wall mass. The surgical procedure has been discussed with the patient. Potential risks, benefits, alternative treatments, and expected outcomes have been explained. All of the patient's questions at this time have been answered. The likelihood of reaching the patient's treatment goal is good. The patient understand the proposed surgical procedure and wishes to proceed.  Wilmon ArmsMatthew K. Corliss Skainssuei, MD, [facility]  General/ Trauma Surgery  05/18/2015 2:13 PM

## 2015-05-26 ENCOUNTER — Encounter (HOSPITAL_BASED_OUTPATIENT_CLINIC_OR_DEPARTMENT_OTHER): Payer: Self-pay | Admitting: *Deleted

## 2015-05-29 ENCOUNTER — Encounter (HOSPITAL_BASED_OUTPATIENT_CLINIC_OR_DEPARTMENT_OTHER)
Admission: RE | Disposition: A | Discharge status: Home / Self Care | Payer: Self-pay | Source: Ambulatory Visit | Attending: Surgery

## 2015-05-29 ENCOUNTER — Encounter (HOSPITAL_BASED_OUTPATIENT_CLINIC_OR_DEPARTMENT_OTHER): Payer: Self-pay

## 2015-05-29 ENCOUNTER — Ambulatory Visit (HOSPITAL_BASED_OUTPATIENT_CLINIC_OR_DEPARTMENT_OTHER)
Admission: RE | Admit: 2015-05-29 | Discharge: 2015-05-29 | Disposition: A | Discharge status: Home / Self Care | Payer: BC Managed Care – PPO | Source: Ambulatory Visit | Attending: Surgery | Admitting: Surgery

## 2015-05-29 ENCOUNTER — Ambulatory Visit (HOSPITAL_BASED_OUTPATIENT_CLINIC_OR_DEPARTMENT_OTHER): Payer: BC Managed Care – PPO | Admitting: Anesthesiology

## 2015-05-29 DIAGNOSIS — Z79891 Long term (current) use of opiate analgesic: Secondary | ICD-10-CM | POA: Diagnosis not present

## 2015-05-29 DIAGNOSIS — K219 Gastro-esophageal reflux disease without esophagitis: Secondary | ICD-10-CM | POA: Diagnosis not present

## 2015-05-29 DIAGNOSIS — Z79899 Other long term (current) drug therapy: Secondary | ICD-10-CM | POA: Insufficient documentation

## 2015-05-29 DIAGNOSIS — M7989 Other specified soft tissue disorders: Secondary | ICD-10-CM | POA: Diagnosis not present

## 2015-05-29 DIAGNOSIS — R222 Localized swelling, mass and lump, trunk: Secondary | ICD-10-CM | POA: Diagnosis present

## 2015-05-29 HISTORY — PX: MASS EXCISION: SHX2000

## 2015-05-29 SURGERY — EXCISION MASS
Anesthesia: General | Site: Abdomen

## 2015-05-29 MED ORDER — PROMETHAZINE HCL 25 MG/ML IJ SOLN: 6.2500 mg | INTRAMUSCULAR | Status: DC | PRN

## 2015-05-29 MED ORDER — DEXAMETHASONE SODIUM PHOSPHATE 10 MG/ML IJ SOLN
INTRAMUSCULAR | Status: AC
  Filled 2015-05-29: qty 1

## 2015-05-29 MED ORDER — MIDAZOLAM HCL 2 MG/2ML IJ SOLN
1.0000 mg | INTRAMUSCULAR | Status: DC | PRN
  Administered 2015-05-29: 1 mg via INTRAVENOUS

## 2015-05-29 MED ORDER — ONDANSETRON HCL 4 MG/2ML IJ SOLN
INTRAMUSCULAR | Status: DC | PRN
  Administered 2015-05-29: 4 mg via INTRAVENOUS

## 2015-05-29 MED ORDER — ONDANSETRON HCL 4 MG/2ML IJ SOLN: 4.0000 mg | INTRAMUSCULAR | Status: DC | PRN

## 2015-05-29 MED ORDER — LIDOCAINE HCL (CARDIAC) 20 MG/ML IV SOLN
INTRAVENOUS | Status: AC
  Filled 2015-05-29: qty 5

## 2015-05-29 MED ORDER — CEFAZOLIN SODIUM-DEXTROSE 2-3 GM-% IV SOLR
INTRAVENOUS | Status: AC
  Filled 2015-05-29: qty 50

## 2015-05-29 MED ORDER — LACTATED RINGERS IV SOLN
INTRAVENOUS | Status: DC
  Administered 2015-05-29 (×2): via INTRAVENOUS

## 2015-05-29 MED ORDER — MIDAZOLAM HCL 2 MG/2ML IJ SOLN
INTRAMUSCULAR | Status: AC
  Filled 2015-05-29: qty 4

## 2015-05-29 MED ORDER — GLYCOPYRROLATE 0.2 MG/ML IJ SOLN: 0.2000 mg | Freq: Once | INTRAMUSCULAR | Status: DC | PRN

## 2015-05-29 MED ORDER — OXYCODONE-ACETAMINOPHEN 5-325 MG PO TABS: 1.0000 | ORAL_TABLET | ORAL | Status: DC | PRN

## 2015-05-29 MED ORDER — LIDOCAINE HCL (CARDIAC) 20 MG/ML IV SOLN
INTRAVENOUS | Status: DC | PRN
  Administered 2015-05-29: 80 mg via INTRAVENOUS

## 2015-05-29 MED ORDER — FENTANYL CITRATE (PF) 100 MCG/2ML IJ SOLN
INTRAMUSCULAR | Status: AC
  Filled 2015-05-29: qty 4

## 2015-05-29 MED ORDER — CEFAZOLIN SODIUM-DEXTROSE 2-3 GM-% IV SOLR
2.0000 g | INTRAVENOUS | Status: AC
  Administered 2015-05-29: 2 g via INTRAVENOUS

## 2015-05-29 MED ORDER — BUPIVACAINE-EPINEPHRINE 0.25% -1:200000 IJ SOLN
INTRAMUSCULAR | Status: DC | PRN
  Administered 2015-05-29: 10 mL

## 2015-05-29 MED ORDER — MORPHINE SULFATE (PF) 2 MG/ML IV SOLN: 2.0000 mg | INTRAVENOUS | Status: DC | PRN

## 2015-05-29 MED ORDER — CHLORHEXIDINE GLUCONATE 4 % EX LIQD: 1.0000 "application " | Freq: Once | CUTANEOUS | Status: DC

## 2015-05-29 MED ORDER — BUPIVACAINE-EPINEPHRINE (PF) 0.25% -1:200000 IJ SOLN
INTRAMUSCULAR | Status: AC
  Filled 2015-05-29: qty 30

## 2015-05-29 MED ORDER — PROPOFOL 10 MG/ML IV BOLUS
INTRAVENOUS | Status: DC | PRN
  Administered 2015-05-29: 170 mg via INTRAVENOUS

## 2015-05-29 MED ORDER — DEXAMETHASONE SODIUM PHOSPHATE 4 MG/ML IJ SOLN
INTRAMUSCULAR | Status: DC | PRN
  Administered 2015-05-29: 10 mg via INTRAVENOUS

## 2015-05-29 MED ORDER — OXYCODONE HCL 5 MG PO TABS
ORAL_TABLET | ORAL | Status: AC
  Filled 2015-05-29: qty 1

## 2015-05-29 MED ORDER — PROPOFOL 10 MG/ML IV BOLUS
INTRAVENOUS | Status: AC
  Filled 2015-05-29: qty 20

## 2015-05-29 MED ORDER — ONDANSETRON HCL 4 MG/2ML IJ SOLN
INTRAMUSCULAR | Status: AC
  Filled 2015-05-29: qty 2

## 2015-05-29 MED ORDER — HYDROMORPHONE HCL 1 MG/ML IJ SOLN: 0.2500 mg | INTRAMUSCULAR | Status: DC | PRN

## 2015-05-29 MED ORDER — OXYCODONE HCL 5 MG PO TABS
5.0000 mg | ORAL_TABLET | Freq: Once | ORAL | Status: AC
  Administered 2015-05-29: 5 mg via ORAL

## 2015-05-29 MED ORDER — FENTANYL CITRATE (PF) 100 MCG/2ML IJ SOLN
50.0000 ug | INTRAMUSCULAR | Status: DC | PRN
  Administered 2015-05-29: 50 ug via INTRAVENOUS
  Administered 2015-05-29: 100 ug via INTRAVENOUS

## 2015-05-29 MED ORDER — SCOPOLAMINE 1 MG/3DAYS TD PT72: 1.0000 | MEDICATED_PATCH | Freq: Once | TRANSDERMAL | Status: DC | PRN

## 2015-05-29 SURGICAL SUPPLY — 40 items
BENZOIN TINCTURE PRP APPL 2/3 (GAUZE/BANDAGES/DRESSINGS) ×2 IMPLANT
BLADE CLIPPER SURG (BLADE) IMPLANT
BLADE SURG 15 STRL LF DISP TIS (BLADE) ×1 IMPLANT
BLADE SURG 15 STRL SS (BLADE) ×1
CANISTER SUCT 1200ML W/VALVE (MISCELLANEOUS) ×2 IMPLANT
CHLORAPREP W/TINT 26ML (MISCELLANEOUS) ×2 IMPLANT
COVER BACK TABLE 60X90IN (DRAPES) ×2 IMPLANT
COVER MAYO STAND STRL (DRAPES) ×2 IMPLANT
DECANTER SPIKE VIAL GLASS SM (MISCELLANEOUS) IMPLANT
DRAPE LAPAROTOMY 100X72 PEDS (DRAPES) ×2 IMPLANT
DRAPE UTILITY XL STRL (DRAPES) ×2 IMPLANT
DRSG TEGADERM 4X4.75 (GAUZE/BANDAGES/DRESSINGS) ×2 IMPLANT
ELECT COATED BLADE 2.86 ST (ELECTRODE) ×2 IMPLANT
ELECT REM PT RETURN 9FT ADLT (ELECTROSURGICAL) ×2
ELECTRODE REM PT RTRN 9FT ADLT (ELECTROSURGICAL) ×1 IMPLANT
GLOVE BIO SURGEON STRL SZ7 (GLOVE) ×2 IMPLANT
GLOVE BIOGEL PI IND STRL 7.0 (GLOVE) ×2 IMPLANT
GLOVE BIOGEL PI IND STRL 7.5 (GLOVE) ×1 IMPLANT
GLOVE BIOGEL PI INDICATOR 7.0 (GLOVE) ×2
GLOVE BIOGEL PI INDICATOR 7.5 (GLOVE) ×1
GLOVE ECLIPSE 6.5 STRL STRAW (GLOVE) ×4 IMPLANT
GOWN STRL REUS W/ TWL LRG LVL3 (GOWN DISPOSABLE) ×1 IMPLANT
GOWN STRL REUS W/TWL LRG LVL3 (GOWN DISPOSABLE) ×1
NEEDLE HYPO 25X1 1.5 SAFETY (NEEDLE) ×2 IMPLANT
NS IRRIG 1000ML POUR BTL (IV SOLUTION) IMPLANT
PACK BASIN DAY SURGERY FS (CUSTOM PROCEDURE TRAY) ×2 IMPLANT
PENCIL BUTTON HOLSTER BLD 10FT (ELECTRODE) ×2 IMPLANT
SPONGE GAUZE 4X4 12PLY STER LF (GAUZE/BANDAGES/DRESSINGS) ×4 IMPLANT
STRIP CLOSURE SKIN 1/2X4 (GAUZE/BANDAGES/DRESSINGS) ×2 IMPLANT
SUT MON AB 4-0 PC3 18 (SUTURE) ×2 IMPLANT
SUT PROLENE 6 0 P 1 18 (SUTURE) IMPLANT
SUT SILK 2 0 FS (SUTURE) IMPLANT
SUT VIC AB 3-0 SH 27 (SUTURE) ×1
SUT VIC AB 3-0 SH 27X BRD (SUTURE) ×1 IMPLANT
SUT VICRYL 3-0 CR8 SH (SUTURE) IMPLANT
SYR CONTROL 10ML LL (SYRINGE) ×2 IMPLANT
TOWEL OR 17X24 6PK STRL BLUE (TOWEL DISPOSABLE) ×2 IMPLANT
TOWEL OR NON WOVEN STRL DISP B (DISPOSABLE) ×2 IMPLANT
TUBE CONNECTING 20X1/4 (TUBING) ×2 IMPLANT
YANKAUER SUCT BULB TIP NO VENT (SUCTIONS) ×2 IMPLANT

## 2015-05-29 NOTE — Anesthesia Procedure Notes (Addendum)
Procedure Name: LMA Insertion Date/Time: 05/29/2015 1:11 PM Performed by: Curly ShoresRAFT, Mitchel Delduca W Pre-anesthesia Checklist: Patient identified, Emergency Drugs available, Suction available and Patient being monitored Patient Re-evaluated:Patient Re-evaluated prior to inductionOxygen Delivery Method: Circle System Utilized Preoxygenation: Pre-oxygenation with 100% oxygen Intubation Type: IV induction Ventilation: Mask ventilation without difficulty LMA: LMA inserted and LMA with gastric port inserted LMA Size: 3.0 Number of attempts: 1 Placement Confirmation: positive ETCO2 and breath sounds checked- equal and bilateral Tube secured with: Tape Dental Injury: Teeth and Oropharynx as per pre-operative assessment

## 2015-05-29 NOTE — Anesthesia Preprocedure Evaluation (Signed)
Anesthesia Evaluation  Patient identified by MRN, date of birth, ID band Patient awake    History of Anesthesia Complications Negative for: history of anesthetic complications  Airway Mallampati: I       Dental  (+) Teeth Intact   Pulmonary neg pulmonary ROS,    breath sounds clear to auscultation       Cardiovascular  Rhythm:Regular Rate:Normal     Neuro/Psych    GI/Hepatic hiatal hernia, GERD  ,  Endo/Other    Renal/GU Renal diseaseRenal stones      Musculoskeletal   Abdominal   Peds  Hematology   Anesthesia Other Findings   Reproductive/Obstetrics                             Anesthesia Physical Anesthesia Plan  ASA: II  Anesthesia Plan: General   Post-op Pain Management:    Induction: Intravenous  Airway Management Planned: LMA  Additional Equipment:   Intra-op Plan:   Post-operative Plan: Extubation in OR  Informed Consent: I have reviewed the patients History and Physical, chart, labs and discussed the procedure including the risks, benefits and alternatives for the proposed anesthesia with the patient or authorized representative who has indicated his/her understanding and acceptance.   Dental advisory given  Plan Discussed with:   Anesthesia Plan Comments:         Anesthesia Quick Evaluation

## 2015-05-29 NOTE — H&P (View-Only) (Signed)
History of Present Illness Rita Diaz. Rita Nanninga MD; 05/18/2015 2:10 PM) The patient is a 59 year old female who presents with non-malignant abdominal pain. s/p open ventral hernia repair with mesh on 07/02/14. She was last seen in May. She is back at work now. She has been seeing GI for chronic constipation. She has noticed some firmness in the middle of her incision, but not bulging. No obstructive symptoms. Her pain is fairly well controlled. She would like to have a check to make sure she doesn't have a recurrence. She underwent a CT scan recently that showed no problems with her hernia repair, but she does have an area of scar tissue vs. fat necrosis in her epigastrium, corresponding to the area of tenderness.  CLINICAL DATA: Patient with ventral abdominal wall pain, evaluate for recurrent ventral hernia superior to the umbilicus.  EXAM: CT ABDOMEN AND PELVIS WITH CONTRAST  TECHNIQUE: Multidetector CT imaging of the abdomen and pelvis was performed using the standard protocol following bolus administration of intravenous contrast.  CONTRAST: 100 cc Omnipaque 300  COMPARISON: CT abdomen pelvis 06/11/2014  FINDINGS: Lower chest: Normal heart size. Dependent atelectasis within the bilateral lower lobes. No pleural effusion. Stable 4 mm right middle lobe pulmonary nodule.  Hepatobiliary: Normal size and contour of the liver. No focal hepatic lesion identified. Patient status post cholecystectomy. Hepatic and portal veins are patent.  Pancreas: Unremarkable  Spleen: Unremarkable  Adrenals/Urinary Tract: Normal adrenal glands. Kidneys enhance symmetrically with contrast. Urinary bladder is decompressed.  Stomach/Bowel: No abnormal bowel wall thickening or evidence for bowel obstruction. No free fluid or free intraperitoneal air.  Vascular/Lymphatic: Normal caliber abdominal aorta. No retroperitoneal lymphadenopathy.  Other: Patient status post  hysterectomy.  Musculoskeletal: Lumbar spine degenerative changes. No aggressive or acute appearing osseous lesions. Within the ventral abdominal wall midline there is a 2.7 x 2.0 cm low-attenuation collection with peripheral irregular enhancement. This is within the subcutaneous fat overlying the area of previous ventral abdominal wall hernia repair (image 40; series 3). No evidence for recurrent hernia.  IMPRESSION: Central low-attenuation collection within the subcutaneous fat overlying the area ventral abdominal wall hernia repair is nonspecific however may represent postoperative seroma and/or fat necrosis. Superimposed infectious process is not excluded. Recommend clinical correlation.  No CT evidence to suggest recurrent ventral abdominal wall hernia.   Electronically Signed By: Annia Belt M.D. On: 05/15/2015 10:09     Allergies Fay Records, CMA; 05/18/2015 11:46 AM) Aspirin EC Lo-Dose *ANALGESICS - NonNarcotic*  Medication History Fay Records, CMA; 05/18/2015 11:46 AM) Percocet (5-325MG  Tablet, 1 (one) Tablet Oral every four hours, as needed, Taken starting 12/29/2014) Active. Nexium 24HR (  Capsule DR, 1 (one) Capsule DR Oral daily, Taken starting 07/23/2014) Active. Furosemide (  Tablet, Oral qd) Active. ALPRAZolam (0.5MG  Tablet, Oral prn) Active. Medications Reconciled    Vitals Fay Records CMA; 05/18/2015 11:46 AM) 05/18/2015 11:46 AM Weight: 128 lb Height: 64in Body Surface Area: 1.62 m Body Mass Index: 21.97 kg/m  Temp.: 7F  Pulse: 80 (Regular)  BP: 128/68 (Sitting, Left Arm, Standard)      Physical Exam Molli Hazard K. Galileah Piggee MD; 05/18/2015 2:11 PM)  The physical exam findings are as follows: Note:WDWN in NAD HEENT: EOMI, sclera anicteric Neck: No masses, no thyromegaly Lungs: CTA bilaterally; normal respiratory effort CV: Regular rate and rhythm; no murmurs Abd: +bowel sounds, soft, no sign of recurrent ventral  hernia; healed incision In the epigastrium about 4 cm above the umbilicus, there is a 3 cm firm tender mass in the subcutaneous  tissues. This corresponds with the area seen on the CT scan Ext: Well-perfused; no edema Skin: Warm, dry; no sign of jaundice    Assessment & Plan Rita Diaz(Senai Ramnath K. Jeff Mccallum MD; 05/18/2015 2:11 PM)  EPIGASTRIC MASS (R19.06)  Current Plans Schedule for surgery - excision of abdominal wall mass. The surgical procedure has been discussed with the patient. Potential risks, benefits, alternative treatments, and expected outcomes have been explained. All of the patient's questions at this time have been answered. The likelihood of reaching the patient's treatment goal is good. The patient understand the proposed surgical procedure and wishes to proceed.  Rita ArmsMatthew K. Corliss Skainssuei, MD, [facility]  General/ Trauma Surgery  05/18/2015 2:13 PM

## 2015-05-29 NOTE — Interval H&P Note (Signed)
History and Physical Interval Note:  05/29/2015 11:58 AM  Jacinto HalimKathy T Diaz  has presented today for surgery, with the diagnosis of ABDOMINAL WALL MASS  The various methods of treatment have been discussed with the patient and family. After consideration of risks, benefits and other options for treatment, the patient has consented to  Procedure(s): EXCISION OF ABDOMINAL  MASS (N/A) as a surgical intervention .  The patient's history has been reviewed, patient examined, no change in status, stable for surgery.  I have reviewed the patient's chart and labs.  Questions were answered to the patient's satisfaction.     Nanna Ertle K.

## 2015-05-29 NOTE — Op Note (Signed)
Prep diagnosis: Subcutaneous mass anterior abdominal wall Postop diagnosis: Same Procedure performed: Excision of subcutaneous mass anterior abdominal wall (3 x 3 cm)  Surgeon:Annalyce Lanpher K. Anesthesia: Gen. via LMA Indications: This is a 59 year old female who is status post open ventral hernia repair with mesh. She has healed up nicely from her ventral hernia repair. However she has persistent pain in a localized area just above the umbilicus. This area was examined with a CT scan and it showed a 2 x 2.7 cm area of scarring that may represent a old seroma versus fat necrosis. There is no sign of any recurrence of her hernia. Because of the ongoing pain associated with this area, I offered to excise the scar tissue.  Description of procedure: The patient is brought to the operating room and placed in a supine position on the operating room table. After an adequate level of general anesthesia was obtained her abdomen was prepped with ChloraPrep and draped sterile fashion. A timeout was taken to ensure the proper patient and proper procedure. We infiltrated the area over the palpable mass with 0.25% Marcaine with epinephrine. A vertical incision was made. Dissection was carried down through the dermis. We then used cautery to excise the hard mass of scar tissue lying underneath the incision. We did encounter a small contained seroma. There also seems to be some fat necrosis. We excised this mass all the way down to the fascia. The fascia seems to be intact. We irrigated the wound thoroughly and inspected for hemostasis. The wound was closed with 2 deep layers of 3-0 Vicryl and a subcuticular layer 4 Monocryl. Steri-Strips and clean dressing were applied. The patient was then next patient brought to the recovery room stable condition. All sponge, initially, and needle counts are correct.  Wilmon ArmsMatthew K. Corliss Skainssuei, MD, [facility]  General/ Trauma Surgery  05/29/2015 1:40 PM

## 2015-05-29 NOTE — Transfer of Care (Signed)
Immediate Anesthesia Transfer of Care Note  Patient: Rita Diaz  Procedure(s) Performed: Procedure(s): EXCISION OF ABDOMINAL  MASS (N/A)  Patient Location: PACU  Anesthesia Type:General  Level of Consciousness: awake, sedated and responds to stimulation  Airway & Oxygen Therapy: Patient Spontanous Breathing and Patient connected to face mask oxygen  Post-op Assessment: Report given to RN, Post -op Vital signs reviewed and stable and Patient moving all extremities  Post vital signs: Reviewed and stable  Last Vitals:  Filed Vitals:   05/29/15 1112  BP: 135/72  Pulse: 63  Temp: 36.7 C  Resp: 20    Complications: No apparent anesthesia complications

## 2015-05-29 NOTE — Anesthesia Postprocedure Evaluation (Signed)
  Anesthesia Post-op Note  Patient: Rita Diaz  Procedure(s) Performed: Procedure(s): EXCISION OF ABDOMINAL  MASS (N/A)  Patient Location: PACU  Anesthesia Type:General  Level of Consciousness: awake and alert   Airway and Oxygen Therapy: Patient Spontanous Breathing  Post-op Pain: mild  Post-op Assessment: Post-op Vital signs reviewed              Post-op Vital Signs: stable  Last Vitals:  Filed Vitals:   05/29/15 1415  BP: 159/91  Pulse: 59  Temp:   Resp: 13    Complications: No apparent anesthesia complications

## 2015-05-29 NOTE — Discharge Instructions (Signed)
Ice packs/ pain medication as needed Remove dressing in 48 hours. You may shower over the Steri-Strips at that time. The Steri-Strips will come off on their own in 1-2 weeks.  Post Anesthesia Home Care Instructions  Activity: Get plenty of rest for the remainder of the day. A responsible adult should stay with you for 24 hours following the procedure.  For the next 24 hours, DO NOT: -Drive a car -Advertising copywriterperate machinery -Drink alcoholic beverages -Take any medication unless instructed by your physician -Make any legal decisions or sign important papers.  Meals: Start with liquid foods such as gelatin or soup. Progress to regular foods as tolerated. Avoid greasy, spicy, heavy foods. If nausea and/or vomiting occur, drink only clear liquids until the nausea and/or vomiting subsides. Call your physician if vomiting continues.  Special Instructions/Symptoms: Your throat may feel dry or sore from the anesthesia or the breathing tube placed in your throat during surgery. If this causes discomfort, gargle with warm salt water. The discomfort should disappear within 24 hours.  If you had a scopolamine patch placed behind your ear for the management of post- operative nausea and/or vomiting:  1. The medication in the patch is effective for 72 hours, after which it should be removed.  Wrap patch in a tissue and discard in the trash. Wash hands thoroughly with soap and water. 2. You may remove the patch earlier than 72 hours if you experience unpleasant side effects which may include dry mouth, dizziness or visual disturbances. 3. Avoid touching the patch. Wash your hands with soap and water after contact with the patch.

## 2015-06-01 ENCOUNTER — Encounter (HOSPITAL_BASED_OUTPATIENT_CLINIC_OR_DEPARTMENT_OTHER): Payer: Self-pay | Admitting: Surgery

## 2015-06-01 NOTE — Addendum Note (Signed)
Addendum  created 06/01/15 0800 by Lance CoonWesley Lear Carstens, CRNA   Modules edited: Charges VN

## 2015-06-28 ENCOUNTER — Other Ambulatory Visit: Payer: Self-pay | Admitting: Family Medicine

## 2015-06-29 NOTE — Telephone Encounter (Signed)
We'll print, but needs an appointment prior to next refill

## 2015-06-29 NOTE — Telephone Encounter (Signed)
Last seen 03/05/15  Dr Dettinger   [facility]

## 2015-06-30 NOTE — Telephone Encounter (Signed)
Rx called to [facility] and told that needs to be seen for any more refills

## 2015-07-17 LAB — HM MAMMOGRAPHY: HM MAMMO: NEGATIVE

## 2015-07-23 ENCOUNTER — Encounter: Payer: Self-pay | Admitting: *Deleted

## 2015-07-27 ENCOUNTER — Telehealth: Payer: Self-pay | Admitting: Family Medicine

## 2015-07-28 NOTE — Telephone Encounter (Signed)
I left patient a message that we will be doing sports physicals in August and we have not set a date yet. We normally do not plan the physicals until June or July.

## 2015-08-06 ENCOUNTER — Ambulatory Visit (INDEPENDENT_AMBULATORY_CARE_PROVIDER_SITE_OTHER): Payer: BC Managed Care – PPO | Admitting: Family Medicine

## 2015-08-06 ENCOUNTER — Encounter: Payer: Self-pay | Admitting: Family Medicine

## 2015-08-06 VITALS — BP 126/86 | HR 93 | Temp 100.4°F | Ht 64.0 in | Wt 179.4 lb

## 2015-08-06 DIAGNOSIS — K529 Noninfective gastroenteritis and colitis, unspecified: Secondary | ICD-10-CM

## 2015-08-06 DIAGNOSIS — M7062 Trochanteric bursitis, left hip: Secondary | ICD-10-CM | POA: Diagnosis not present

## 2015-08-06 MED ORDER — CIPROFLOXACIN HCL 500 MG PO TABS: 500.0000 mg | ORAL_TABLET | Freq: Two times a day (BID) | ORAL | Status: DC

## 2015-08-06 MED ORDER — NAPROXEN 500 MG PO TABS: 500.0000 mg | ORAL_TABLET | Freq: Two times a day (BID) | ORAL | Status: DC

## 2015-08-06 NOTE — Progress Notes (Signed)
BP 126/86 mmHg  Pulse 93  Temp(Src) 100.4 F (38 C) (Oral)  Ht  (1.626 m)  Wt 179 lb 6.4 oz (81.375 kg)  BMI 30.78 kg/m2   Subjective:    Patient ID: Rita Diaz, female    DOB: 04/07/1956, 60 y.o.   MRN: [id number]  HPI: Rita Diaz is a 60 y.o. female presenting on 08/06/2015 for Left side pain; Nausea; and Fever   HPI Fever and abdominal pain Patient has had fever and nausea and vomiting and left-sided abdominal pain. She's never had pain quite like this before. Her fever today is 100.4. She denies any blood in her stool or diarrhea. She has been having vomiting over the past 2 days but denies any blood in her vomitus. The pain is vague left-sided abdominal pain and does not radiate anywhere else.  Left-sided hip pain Patient has been having left-sided hip pain that hurts on the lateral aspect of her hip and is difficult to lay on that side of her head because of the pain and when she does lay on it or when it flares up the pain can sometimes radiate down her lateral aspect of her leg to her knee. She denies any chills or overlying skin changes.  Relevant past medical, surgical, family and social history reviewed and updated as indicated. Interim medical history since our last visit reviewed. Allergies and medications reviewed and updated.  Review of Systems  Constitutional: Negative for fever and chills.  HENT: Negative for congestion, ear discharge and ear pain.   Eyes: Negative for redness and visual disturbance.  Respiratory: Negative for chest tightness and shortness of breath.   Cardiovascular: Negative for chest pain and leg swelling.  Gastrointestinal: Positive for nausea, vomiting and abdominal pain. Negative for diarrhea, constipation, abdominal distention and rectal pain.  Genitourinary: Negative for dysuria and difficulty urinating.  Musculoskeletal: Positive for arthralgias. Negative for myalgias, back pain, joint swelling and gait problem.  Skin: Negative  for color change, rash and wound.  Neurological: Negative for dizziness, light-headedness and headaches.  Psychiatric/Behavioral: Negative for behavioral problems and agitation.  All other systems reviewed and are negative.   Per HPI unless specifically indicated above     Medication List       This list is accurate as of: 08/06/15 11:37 AM.  Always use your most recent med list.               ALPRAZolam 0.5 MG tablet  Commonly known as:  XANAX  TAKE ONE TABLET BY MOUTH THREE TIMES DAILY AS NEEDED     ciprofloxacin 500 MG tablet  Commonly known as:  CIPRO  Take 1 tablet (500 mg total) by mouth 2 (two) times daily.     naproxen 500 MG tablet  Commonly known as:  NAPROSYN  Take 1 tablet (500 mg total) by mouth 2 (two) times daily with a meal.     oxyCODONE-acetaminophen 5-325 MG tablet  Commonly known as:  PERCOCET/ROXICET  Take 1 tablet by mouth every 4 (four) hours as needed for severe pain.          Objective:    BP 126/86 mmHg  Pulse 93  Temp(Src) 100.4 F (38 C) (Oral)  Ht  (1.626 m)  Wt 179 lb 6.4 oz (81.375 kg)  BMI 30.78 kg/m2  Wt Readings from Last 3 Encounters:  08/06/15 179 lb 6.4 oz (81.375 kg)  05/29/15 180 lb (81.647 kg)  03/05/15 181 lb (82.101 kg)  Physical Exam  Constitutional: She is oriented to person, place, and time. She appears well-developed and well-nourished. No distress.  Eyes: Conjunctivae and EOM are normal. Pupils are equal, round, and reactive to light.  Cardiovascular: Normal rate, regular rhythm, normal heart sounds and intact distal pulses.   No murmur heard. Pulmonary/Chest: Effort normal and breath sounds normal. No respiratory distress. She has no wheezes.  Abdominal: Soft. Bowel sounds are normal. She exhibits no distension and no mass. There is tenderness in the right lower quadrant. There is no rigidity, no rebound, no guarding, no CVA tenderness, no tenderness at McBurney's point and negative Murphy's sign.    Musculoskeletal: Normal range of motion. She exhibits no edema.       Left hip: She exhibits tenderness. She exhibits normal range of motion, normal strength, no bony tenderness, no swelling, no crepitus and no deformity.       Legs: Neurological: She is alert and oriented to person, place, and time. Coordination normal.  Skin: Skin is warm and dry. No rash noted. She is not diaphoretic.  Psychiatric: She has a normal mood and affect. Her behavior is normal.  Nursing note and vitals reviewed.   Results for orders placed or performed in visit on 07/23/15  [facility] MAMMOGRAPHY  Result Value Ref Range   HM Mammogram Negative       Assessment & Plan:   Problem List Items Addressed This Visit    None    Visit Diagnoses    Trochanteric bursitis of left hip    -  Primary    Relevant Medications    naproxen (NAPROSYN) 500 MG tablet    Colitis        Relevant Medications    ciprofloxacin (CIPRO) 500 MG tablet        Follow up plan: Return if symptoms worsen or fail to improve.  Counseling provided for all of the vaccine components No orders of the defined types were placed in this encounter.    Arville Care, MD [facility] 08/06/2015, 11:37 AM

## 2015-08-07 ENCOUNTER — Telehealth: Payer: Self-pay | Admitting: Family Medicine

## 2015-08-07 NOTE — Telephone Encounter (Signed)
Stp and she states that the Naprosyn is causing her stomach to "burn" and she thinks it is because naprosyn has aspirin in it. Please advise.

## 2015-08-07 NOTE — Telephone Encounter (Signed)
Naprosyn does not have aspirin in it but if that is the reaction she had to aspirin and Naprosyn and that is a normal known reaction because any anti-inflammatory medication can cause some heartburn. If she takes it with food the night can help reduce the amount of burning and acid pain that she gets in her stomach. She can also take something like over-the-counter Zantac with it as well. That is not an allergic reaction but a known reaction to these medications can cause. The Naprosyn is a similar class of medications to aspirin but does not actually have aspirin in it

## 2015-08-07 NOTE — Telephone Encounter (Signed)
Stp and advised of MD feedback. Pt voiced understanding. 

## 2015-09-10 ENCOUNTER — Telehealth: Payer: Self-pay | Admitting: Family Medicine

## 2015-09-10 NOTE — Telephone Encounter (Signed)
Pt given appt with Dr.Dettinger 09/16/15 at 10:55.

## 2015-09-16 ENCOUNTER — Ambulatory Visit (INDEPENDENT_AMBULATORY_CARE_PROVIDER_SITE_OTHER): Payer: BC Managed Care – PPO | Admitting: Family Medicine

## 2015-09-16 ENCOUNTER — Encounter: Payer: Self-pay | Admitting: Family Medicine

## 2015-09-16 ENCOUNTER — Other Ambulatory Visit: Payer: Self-pay | Admitting: Family Medicine

## 2015-09-16 VITALS — BP 134/84 | HR 63 | Temp 98.0°F | Ht 64.0 in | Wt 182.8 lb

## 2015-09-16 DIAGNOSIS — Z13220 Encounter for screening for lipoid disorders: Secondary | ICD-10-CM | POA: Diagnosis not present

## 2015-09-16 DIAGNOSIS — R5383 Other fatigue: Secondary | ICD-10-CM | POA: Diagnosis not present

## 2015-09-16 MED ORDER — ALPRAZOLAM 0.5 MG PO TABS: 0.5000 mg | ORAL_TABLET | Freq: Three times a day (TID) | ORAL | Status: DC | PRN

## 2015-09-16 NOTE — Telephone Encounter (Signed)
Seen today! You are the PCP. Route to pool

## 2015-09-16 NOTE — Telephone Encounter (Signed)
Per Dr. Louanne Skye, prescription was phoned in to [facility] for Alprazolam 0.5 mg, one three times daily as needed, #90, with no refills.  Patient informed.

## 2015-09-16 NOTE — Telephone Encounter (Signed)
Okay to print refill? 

## 2015-09-16 NOTE — Progress Notes (Signed)
BP 134/84 mmHg  Pulse 63  Temp(Src) 98 F (36.7 C) (Oral)  Ht '5\' 4"'$  (1.626 m)  Wt 182 lb 12.8 oz (82.918 kg)  BMI 31.36 kg/m2   Subjective:    Patient ID: Rita Diaz, female    DOB: 12-Jan-1956, 60 y.o.   MRN: [id number]  HPI: Rita Diaz is a 60 y.o. female presenting on 09/16/2015 for Fatigue   HPI Fatigue Patient comes in complaining of increased fatigue over the past few weeks. She has noticed that she is waking up without energy and feeling like she has not slept. She is also falling asleep more frequently throughout the day. She also feels like she has been trying to lose weight and has not been able to. She wanted to know if her hormone levels were normal. She is postmenopausal and has not had period in quite a few years. She does not have any bleeding in her stools. She denies any major symptoms of hot flashes or menopausal symptoms. She denies any headaches. She denies snoring but she is going to ask her husband more about that. She denies any knowledge of waking up gasping for air. She gets about 7-8 hours of sleep a night. It is been at least a couple years since she's had any labs done. She denies any chest pain or shortness of breath. She denies any signs of depression or anxiety or sadness. She denies any hair changes or issues with hot or cold. She has had intermittently constipation but no diarrhea. She denies any palpitations. She does wake up once at night in the middle of night but does not know why and it does not initially because she has to urinate.  Relevant past medical, surgical, family and social history reviewed and updated as indicated. Interim medical history since our last visit reviewed. Allergies and medications reviewed and updated.  Review of Systems  Constitutional: Positive for fatigue. Negative for fever, chills and unexpected weight change.  HENT: Negative for congestion, ear discharge and ear pain.   Eyes: Negative for redness and visual  disturbance.  Respiratory: Negative for cough, chest tightness and shortness of breath.   Cardiovascular: Negative for chest pain, palpitations and leg swelling.  Gastrointestinal: Negative for nausea, vomiting, abdominal pain, diarrhea and constipation.  Endocrine: Negative for cold intolerance, heat intolerance, polydipsia and polyuria.  Genitourinary: Negative for dysuria and difficulty urinating.  Musculoskeletal: Negative for back pain and gait problem.  Skin: Negative for color change and rash.  Neurological: Negative for light-headedness and headaches.  Psychiatric/Behavioral: Negative for suicidal ideas, behavioral problems, sleep disturbance, self-injury, dysphoric mood, decreased concentration and agitation. The patient is not nervous/anxious.   All other systems reviewed and are negative.   Per HPI unless specifically indicated above     Medication List       This list is accurate as of: 09/16/15 11:29 AM.  Always use your most recent med list.               ALPRAZolam 0.5 MG tablet  Commonly known as:  XANAX  TAKE ONE TABLET BY MOUTH THREE TIMES DAILY AS NEEDED           Objective:    BP 134/84 mmHg  Pulse 63  Temp(Src) 98 F (36.7 C) (Oral)  Ht '5\' 4"'$  (1.626 m)  Wt 182 lb 12.8 oz (82.918 kg)  BMI 31.36 kg/m2  Wt Readings from Last 3 Encounters:  09/16/15 182 lb 12.8 oz (82.918 kg)  08/06/15 179  lb 6.4 oz (81.375 kg)  05/29/15 180 lb (81.647 kg)    Physical Exam  Constitutional: She is oriented to person, place, and time. She appears well-developed and well-nourished. No distress.  HENT:  Right Ear: Tympanic membrane and ear canal normal.  Left Ear: Tympanic membrane and ear canal normal.  Nose: Nose normal.  Mouth/Throat: Uvula is midline, oropharynx is clear and moist and mucous membranes are normal. No oropharyngeal exudate, posterior oropharyngeal edema, posterior oropharyngeal erythema or tonsillar abscesses.  Mallampati score of 3  Eyes:  Conjunctivae and EOM are normal. Pupils are equal, round, and reactive to light.  Neck: Neck supple. No thyromegaly present.  Cardiovascular: Normal rate, regular rhythm, normal heart sounds and intact distal pulses.   No murmur heard. Pulmonary/Chest: Effort normal and breath sounds normal. No respiratory distress. She has no wheezes.  Musculoskeletal: Normal range of motion. She exhibits no edema or tenderness.  Lymphadenopathy:    She has no cervical adenopathy.  Neurological: She is alert and oriented to person, place, and time. Coordination normal.  Skin: Skin is warm and dry. No rash noted. She is not diaphoretic.  Psychiatric: She has a normal mood and affect. Her behavior is normal. Judgment and thought content normal. Her mood appears not anxious. Her affect is not blunt and not labile. She does not exhibit a depressed mood. She expresses no suicidal ideation. She expresses no suicidal plans.  Nursing note and vitals reviewed.  Results for orders placed or performed in visit on 07/23/15  [facility] MAMMOGRAPHY  Result Value Ref Range   HM Mammogram Negative       Assessment & Plan:   Problem List Items Addressed This Visit    None    Visit Diagnoses    Other fatigue    -  Primary    Will check labs for fatigue, consider sleep apnea as a possible cause, patient wants to hold off on this for now    Relevant Orders    CMP14+EGFR    TSH    CBC with Differential/Platelet    Lipid screening        Relevant Orders    Lipid panel       Follow up plan: Return in about 4 weeks (around 10/14/2015), or if symptoms worsen or fail to improve, for Pap and well woman exam.  Counseling provided for all of the vaccine components Orders Placed This Encounter  Procedures  . CMP14+EGFR  . Lipid panel  . TSH  . CBC with Differential/Platelet    Caryl Pina, MD [facility] 09/16/2015, 11:29 AM

## 2015-09-17 ENCOUNTER — Encounter: Payer: Self-pay | Admitting: Nurse Practitioner

## 2015-09-17 ENCOUNTER — Ambulatory Visit (INDEPENDENT_AMBULATORY_CARE_PROVIDER_SITE_OTHER): Payer: BC Managed Care – PPO | Admitting: Nurse Practitioner

## 2015-09-17 VITALS — BP 148/79 | HR 71 | Temp 97.5°F | Ht 64.0 in | Wt 184.0 lb

## 2015-09-17 DIAGNOSIS — J069 Acute upper respiratory infection, unspecified: Secondary | ICD-10-CM | POA: Diagnosis not present

## 2015-09-17 DIAGNOSIS — R0989 Other specified symptoms and signs involving the circulatory and respiratory systems: Secondary | ICD-10-CM

## 2015-09-17 LAB — CMP14+EGFR
A/G RATIO: 2.3 (ref 1.1–2.5)
ALK PHOS: 83 IU/L (ref 39–117)
ALT: 18 IU/L (ref 0–32)
AST: 22 IU/L (ref 0–40)
Albumin: 4.4 g/dL (ref 3.5–5.5)
BILIRUBIN TOTAL: 0.6 mg/dL (ref 0.0–1.2)
BUN/Creatinine Ratio: 17 (ref 9–23)
BUN: 13 mg/dL (ref 6–24)
CHLORIDE: 102 mmol/L (ref 96–106)
CO2: 25 mmol/L (ref 18–29)
Calcium: 9 mg/dL (ref 8.7–10.2)
Creatinine, Ser: 0.77 mg/dL (ref 0.57–1.00)
GFR calc Af Amer: 98 mL/min/{1.73_m2} (ref >59)
GFR calc non Af Amer: 85 mL/min/{1.73_m2} (ref >59)
GLUCOSE: 93 mg/dL (ref 65–99)
Globulin, Total: 1.9 g/dL (ref 1.5–4.5)
Potassium: 4.5 mmol/L (ref 3.5–5.2)
Sodium: 142 mmol/L (ref 134–144)
Total Protein: 6.3 g/dL (ref 6.0–8.5)

## 2015-09-17 LAB — CBC WITH DIFFERENTIAL/PLATELET
BASOS ABS: 0 10*3/uL (ref 0.0–0.2)
BASOS: 0 %
EOS (ABSOLUTE): 0.1 10*3/uL (ref 0.0–0.4)
EOS: 1 %
HEMOGLOBIN: 13.9 g/dL (ref 11.1–15.9)
Hematocrit: 40.1 % (ref 34.0–46.6)
IMMATURE GRANS (ABS): 0 10*3/uL (ref 0.0–0.1)
Immature Granulocytes: 0 %
LYMPHS: 31 %
Lymphocytes Absolute: 1.8 10*3/uL (ref 0.7–3.1)
MCH: 29.4 pg (ref 26.6–33.0)
MCHC: 34.7 g/dL (ref 31.5–35.7)
MCV: 85 fL (ref 79–97)
MONOCYTES: 4 %
Monocytes Absolute: 0.3 10*3/uL (ref 0.1–0.9)
NEUTROS ABS: 3.5 10*3/uL (ref 1.4–7.0)
Neutrophils: 64 %
Platelets: 252 10*3/uL (ref 150–379)
RBC: 4.72 x10E6/uL (ref 3.77–5.28)
RDW: 12.5 % (ref 12.3–15.4)
WBC: 5.6 10*3/uL (ref 3.4–10.8)

## 2015-09-17 LAB — LIPID PANEL
CHOLESTEROL TOTAL: 161 mg/dL (ref 100–199)
Chol/HDL Ratio: 2.2 ratio units (ref 0.0–4.4)
HDL: 73 mg/dL (ref >39)
LDL Calculated: 77 mg/dL (ref 0–99)
TRIGLYCERIDES: 53 mg/dL (ref 0–149)
VLDL Cholesterol Cal: 11 mg/dL (ref 5–40)

## 2015-09-17 LAB — TSH: TSH: 0.996 u[IU]/mL (ref 0.450–4.500)

## 2015-09-17 LAB — POCT INFLUENZA A/B
INFLUENZA A, POC: NEGATIVE
INFLUENZA B, POC: NEGATIVE

## 2015-09-17 MED ORDER — AZITHROMYCIN 250 MG PO TABS: ORAL_TABLET | ORAL | Status: DC

## 2015-09-17 NOTE — Progress Notes (Addendum)
  Subjective:     Rita Diaz is a 60 y.o. female who presents for evaluation of sinus pain. Symptoms include: congestion, cough, facial pain, headaches, nasal congestion, sinus pressure and sore throat. Onset of symptoms was 4 day ago. Symptoms have been gradually worsening since that time. Past history is significant for no history of pneumonia or bronchitis. Patient is a non-smoker. 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  The following portions of the patient's history were reviewed and updated as appropriate: allergies, current medications, past family history, past medical history, past social history, past surgical history and problem list.  Review of Systems Pertinent items are noted in HPI.   Objective:    BP 148/79 mmHg  Pulse 71  Temp(Src) 97.5 F (36.4 C) (Oral)  Ht  (1.626 m)  Wt 184 lb (83.462 kg)  BMI 31.57 kg/m2 General appearance: alert, cooperative and no distress Eyes: conjunctivae/corneas clear. PERRL, EOM's intact. Fundi benign. Ears: normal TM's and external ear canals both ears Nose: purulent discharge, moderate congestion, turbinates pale, no sinus tenderness Throat: lips, mucosa, and tongue normal; teeth and gums normal Neck: no adenopathy, no carotid bruit, no JVD, supple, symmetrical, trachea midline and thyroid not enlarged, symmetric, no tenderness/mass/nodules Lungs: clear to auscultation bilaterally Heart: regular rate and rhythm, S1, S2 normal, no murmur, click, rub or gallop       Assessment:    Acute bacterial upper resp infection .    Plan:   1. Take meds as prescribed 2. Use a cool mist humidifier especially during the winter months and when heat has been humid. 3. Use saline nose sprays frequently 4. Saline irrigations of the nose can be very helpful if done frequently.  * 4X daily for 1 week*  * Use of a nettie pot can be helpful with this. Follow directions with this* 5. Drink plenty of fluids 6.  Keep thermostat turn down low 7.For any cough or congestion  Use plain Mucinex- regular strength or max strength is fine   * Children- consult with Pharmacist for dosing 8. For fever or aces or pains- take tylenol or ibuprofen appropriate for age and weight.  * for fevers greater than 101 orally you may alternate ibuprofen and tylenol every  3 hours.   Meds ordered this encounter  Medications  . azithromycin (ZITHROMAX) 250 MG tablet    Sig: Two tablets day one, then one tablet daily next 4 days.    Dispense:  6 tablet    Refill:  0    Order Specific Question:  Supervising Provider    Answer:  Ernestina Penna [[id number]]   Mary-Margaret Daphine Deutscher, FNP

## 2015-09-17 NOTE — Patient Instructions (Signed)

## 2015-09-21 ENCOUNTER — Telehealth: Payer: Self-pay | Admitting: Nurse Practitioner

## 2015-09-21 NOTE — Telephone Encounter (Signed)
FYI, spoke with patient she is still having chest congestion, cough, and chills but she is not running any fever.  Patient finished Zpak yesterday.  I told the patient that the Zpak would stay in her body for 5 more days and continue to work.  I advised the patient if she started developing any shortness of breath, wheezing, or body aches she should come back to be seen.

## 2015-11-06 ENCOUNTER — Telehealth: Payer: Self-pay | Admitting: Family Medicine

## 2015-11-06 NOTE — Telephone Encounter (Signed)
Patient advised that she will need to follow up with her PCP that Dr. Christell ConstantMoore does not have any available appointments today.

## 2015-11-09 ENCOUNTER — Other Ambulatory Visit: Payer: Self-pay

## 2015-11-09 MED ORDER — FUROSEMIDE 20 MG PO TABS: 20.0000 mg | ORAL_TABLET | Freq: Every day | ORAL | Status: DC

## 2015-11-09 NOTE — Telephone Encounter (Signed)
Last seen 09/17/15  MMM   This med not on med list

## 2015-11-25 IMAGING — CT CT ABD-PELV W/ CM
2 of 5 series · 10 of 36 positions shown, 17 images · IV contrast (READICAT/WATER & [ID] ISOVUE 300)
Comparison: CT abdomen pelvis 06/11/2014

CLINICAL DATA: Patient with ventral abdominal wall pain, evaluate
for recurrent ventral hernia superior to the umbilicus.

EXAM:
CT ABDOMEN AND PELVIS WITH CONTRAST
TECHNIQUE: Multidetector CT imaging of the abdomen and pelvis was performed
using the standard protocol following bolus administration of
intravenous contrast.
CONTRAST:  100 cc Omnipaque 300

[Series 3: abd/pelvis with · axial · 0.70mm/px · z∈[-325,+30]mm · 9 of 88 slices shown, 15 images]
[im 9/88  soft-tissue]
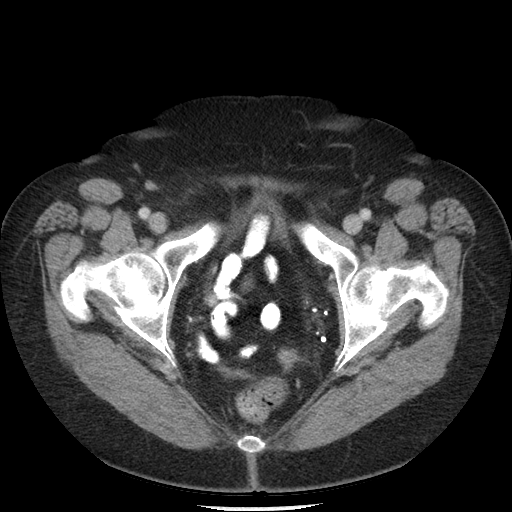
[im 9/88  bone]
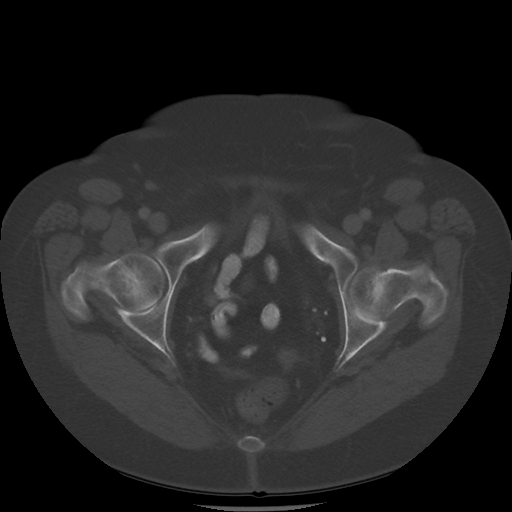
[im 18/88  soft-tissue]
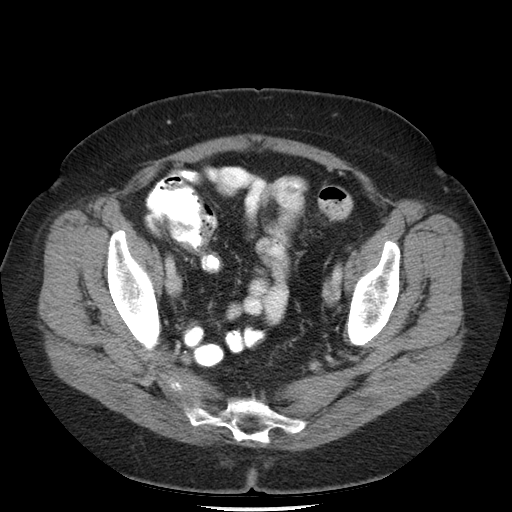
[im 27/88  soft-tissue]
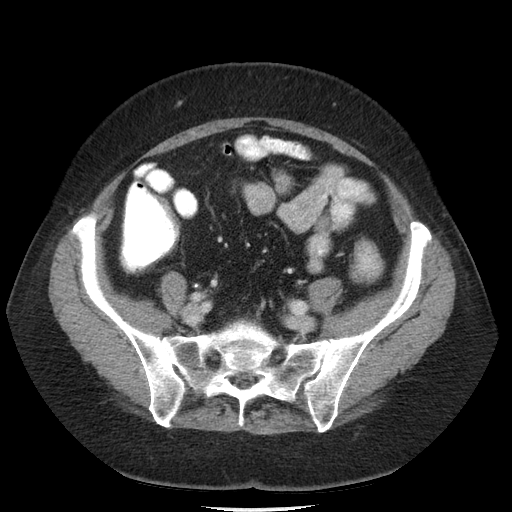
[im 35/88  soft-tissue]
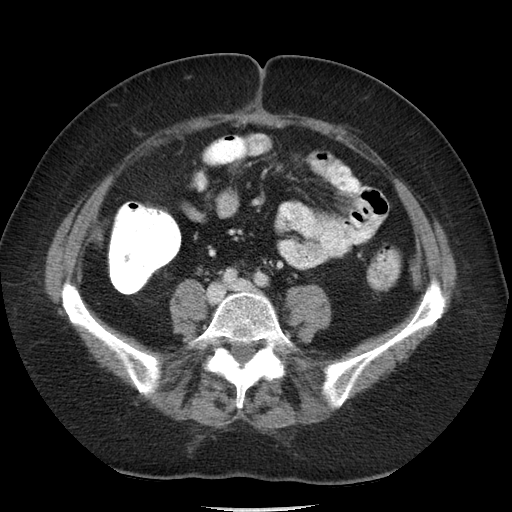
[im 44/88  soft-tissue]
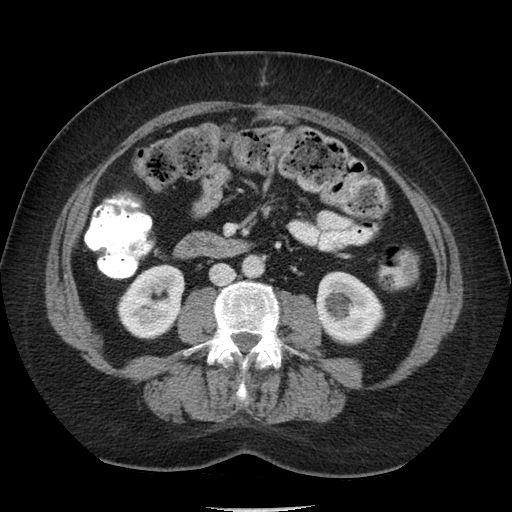
[im 53/88  soft-tissue]
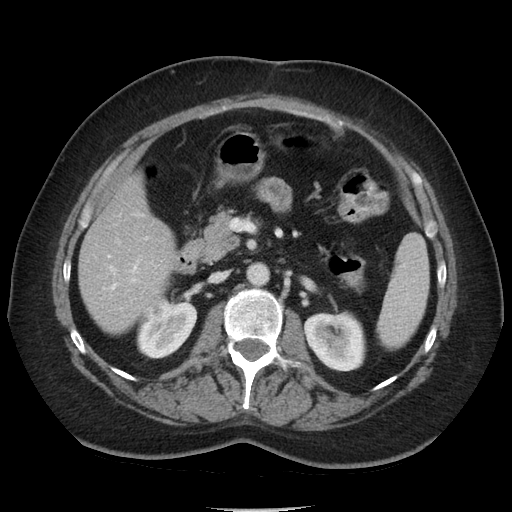
[im 53/88  lung]
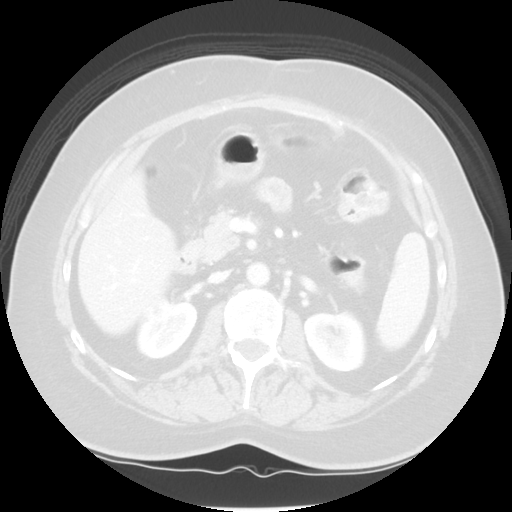
[im 61/88  soft-tissue]
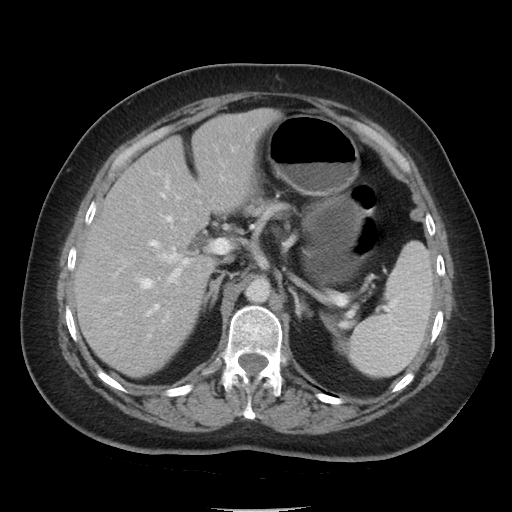
[im 61/88  lung]
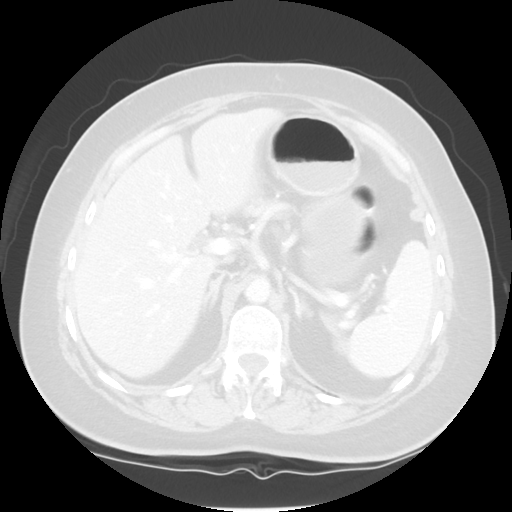
[im 70/88  soft-tissue]
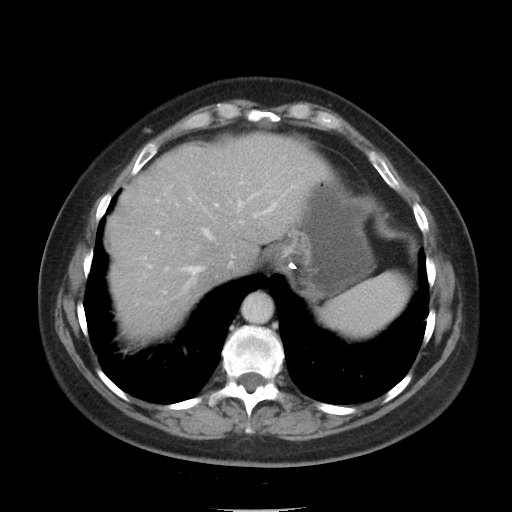
[im 70/88  lung]
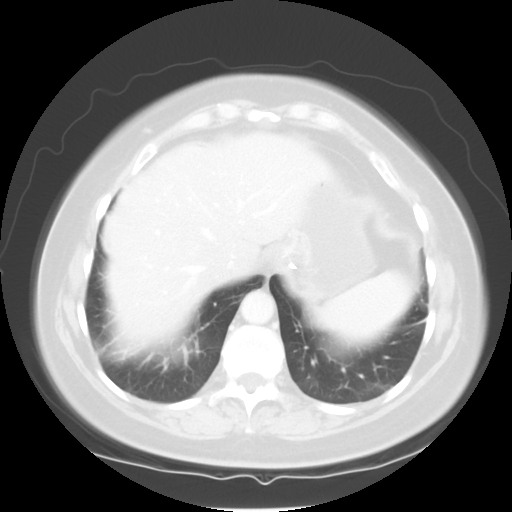
[im 79/88  soft-tissue]
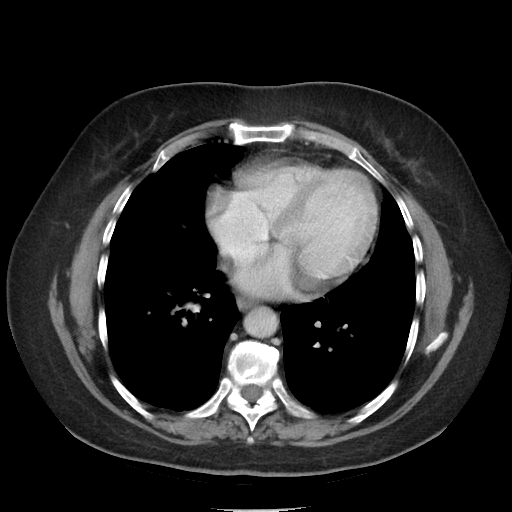
[im 79/88  lung]
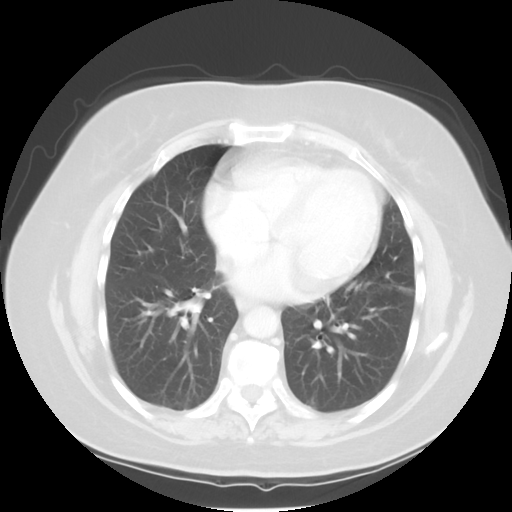
[im 79/88  bone]
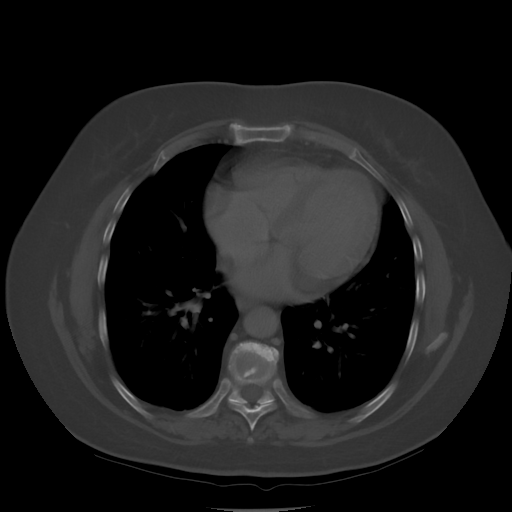

[Series 601: coronal body · coronal · 0.95mm/px · 1 of 128 slices shown, 2 images]
[im 43/128  soft-tissue]
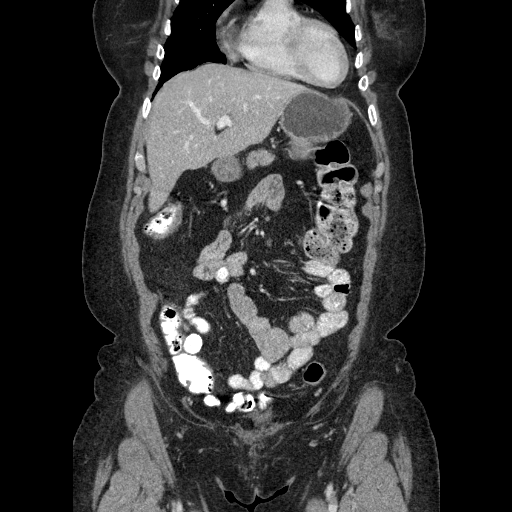
[im 43/128  bone]
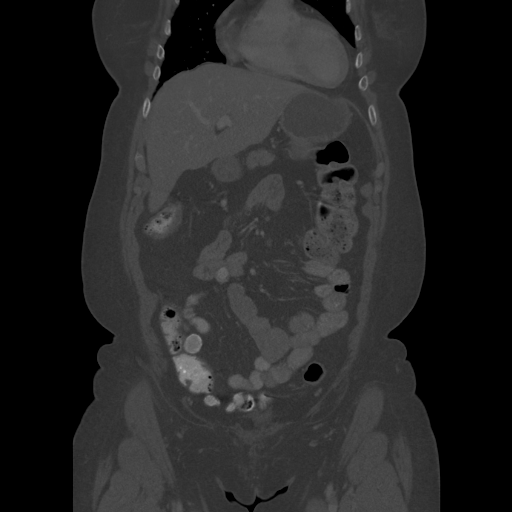

[10 of 36 positions shown; findings below may reference images not displayed]

FINDINGS: Lower chest: Normal heart size. Dependent atelectasis within the
bilateral lower lobes. No pleural effusion. Stable 4 mm right middle
lobe pulmonary nodule.

Hepatobiliary: Normal size and contour of the liver. No focal
hepatic lesion identified. Patient status post cholecystectomy.
Hepatic and portal veins are patent.

Pancreas: Unremarkable

Spleen: Unremarkable

Adrenals/Urinary Tract: Normal adrenal glands. Kidneys enhance
symmetrically with contrast. Urinary bladder is decompressed.

Stomach/Bowel: No abnormal bowel wall thickening or evidence for
bowel obstruction. No free fluid or free intraperitoneal air.

Vascular/Lymphatic: Normal caliber abdominal aorta. No
retroperitoneal lymphadenopathy.

Other: Patient status post hysterectomy.

Musculoskeletal: Lumbar spine degenerative changes. No aggressive or
acute appearing osseous lesions. Within the ventral abdominal wall
midline there is a 2.7 x 2.0 cm low-attenuation collection with
peripheral irregular enhancement. This is within the subcutaneous
fat overlying the area of previous ventral abdominal wall hernia
repair (image 40; series 3). No evidence for recurrent hernia.
IMPRESSION: Central low-attenuation collection within the subcutaneous fat
overlying the area ventral abdominal wall hernia repair is
nonspecific however may represent postoperative seroma and/or fat
necrosis. Superimposed infectious process is not excluded. Recommend
clinical correlation.

No CT evidence to suggest recurrent ventral abdominal wall hernia.

## 2015-12-04 ENCOUNTER — Other Ambulatory Visit: Payer: Self-pay | Admitting: Family Medicine

## 2015-12-04 NOTE — Telephone Encounter (Signed)
Please call in xanax with 1 refills 

## 2015-12-04 NOTE — Telephone Encounter (Signed)
Last seen 09/17/15  MMM If approved route to [facility]

## 2015-12-11 ENCOUNTER — Encounter: Payer: Self-pay | Admitting: Family Medicine

## 2015-12-11 ENCOUNTER — Ambulatory Visit (INDEPENDENT_AMBULATORY_CARE_PROVIDER_SITE_OTHER): Payer: BC Managed Care – PPO | Admitting: Family Medicine

## 2015-12-11 VITALS — BP 146/81 | HR 61 | Temp 97.2°F | Ht 64.0 in | Wt 184.0 lb

## 2015-12-11 DIAGNOSIS — R140 Abdominal distension (gaseous): Secondary | ICD-10-CM

## 2015-12-11 MED ORDER — ONDANSETRON 4 MG PO TBDP: 4.0000 mg | ORAL_TABLET | Freq: Three times a day (TID) | ORAL | Status: DC | PRN

## 2015-12-11 MED ORDER — RANITIDINE HCL 150 MG PO TABS: 150.0000 mg | ORAL_TABLET | Freq: Two times a day (BID) | ORAL | Status: DC

## 2015-12-11 NOTE — Progress Notes (Signed)
BP 146/81 mmHg  Pulse 61  Temp(Src) 97.2 F (36.2 C) (Oral)  Ht  (1.626 m)  Wt 184 lb (83.462 kg)  BMI 31.57 kg/m2   Subjective:    Patient ID: Rita Diaz, female    DOB: 1956-07-02, 60 y.o.   MRN: [id number]  HPI: Rita Diaz is a 60 y.o. female presenting on 12/11/2015 for Abdominal Pain   HPI Epigastric abdominal pain and bloating and distention patient has been having epigastric abdominal pain and bloating and discomfort. The pain is low-grade and 2 out of 10 but is more of that discomfort and bloating that she's been experiencing. She has had multiple for abdominal surgeries and thinks this may be related to this and she has an appointment to go see her surgeon next week but was coming in here to see if we can do anything to help. She is also been having some nausea and some early satiety and gassiness. She denies any constipation or diarrhea or blood in her stool.  Relevant past medical, surgical, family and social history reviewed and updated as indicated. Interim medical history since our last visit reviewed. Allergies and medications reviewed and updated.  Review of Systems  Constitutional: Negative for fever and chills.  HENT: Negative for congestion, ear discharge and ear pain.   Eyes: Negative for redness and visual disturbance.  Respiratory: Negative for chest tightness and shortness of breath.   Cardiovascular: Negative for chest pain and leg swelling.  Gastrointestinal: Positive for nausea, abdominal pain and abdominal distention. Negative for vomiting, diarrhea, constipation, blood in stool and anal bleeding.  Genitourinary: Negative for dysuria and difficulty urinating.  Musculoskeletal: Negative for back pain and gait problem.  Skin: Negative for rash.  Neurological: Negative for light-headedness and headaches.  Psychiatric/Behavioral: Negative for behavioral problems and agitation.  All other systems reviewed and are negative.   Per HPI unless  specifically indicated above     Medication List       This list is accurate as of: 12/11/15 11:31 AM.  Always use your most recent med list.               ALPRAZolam 0.5 MG tablet  Commonly known as:  XANAX  TAKE 1 TABLET BY MOUTH 3 TIMES A DAY AS NEEDED     furosemide 20 MG tablet  Commonly known as:  LASIX  Take 1 tablet (20 mg total) by mouth daily.     ondansetron 4 MG disintegrating tablet  Commonly known as:  ZOFRAN-ODT  Take 1 tablet (4 mg total) by mouth every 8 (eight) hours as needed for nausea or vomiting.     OXYCODONE HCL PO  Take by mouth.     ranitidine 150 MG tablet  Commonly known as:  ZANTAC  Take 1 tablet (150 mg total) by mouth 2 (two) times daily.           Objective:    BP 146/81 mmHg  Pulse 61  Temp(Src) 97.2 F (36.2 C) (Oral)  Ht  (1.626 m)  Wt 184 lb (83.462 kg)  BMI 31.57 kg/m2  Wt Readings from Last 3 Encounters:  12/11/15 184 lb (83.462 kg)  09/17/15 184 lb (83.462 kg)  09/16/15 182 lb 12.8 oz (82.918 kg)    Physical Exam  Constitutional: She is oriented to person, place, and time. She appears well-developed and well-nourished. No distress.  Eyes: Conjunctivae and EOM are normal. Pupils are equal, round, and reactive to light.  Cardiovascular: Normal  rate, regular rhythm, normal heart sounds and intact distal pulses.   No murmur heard. Pulmonary/Chest: Effort normal and breath sounds normal. No respiratory distress. She has no wheezes.  Abdominal: Soft. Bowel sounds are normal. She exhibits no distension. There is no hepatosplenomegaly. There is tenderness (Mild commonly associated tenderness, significant scar tissue with a vertical midline incision.) in the epigastric area. There is no rigidity, no rebound, no guarding and no CVA tenderness. No hernia.  Musculoskeletal: Normal range of motion. She exhibits no edema or tenderness.  Neurological: She is alert and oriented to person, place, and time. Coordination normal.    Skin: Skin is warm and dry. No rash noted. She is not diaphoretic.  Psychiatric: She has a normal mood and affect. Her behavior is normal.  Nursing note and vitals reviewed.     Assessment & Plan:   Problem List Items Addressed This Visit    None    Visit Diagnoses    Abdominal bloating    -  Primary    Relevant Medications    ondansetron (ZOFRAN-ODT) 4 MG disintegrating tablet    ranitidine (ZANTAC) 150 MG tablet       Follow up plan: Return if symptoms worsen or fail to improve.  Counseling provided for all of the vaccine components No orders of the defined types were placed in this encounter.    Arville CareJoshua Dettinger, MD [facility] 12/11/2015, 11:31 AM

## 2015-12-28 ENCOUNTER — Other Ambulatory Visit: Payer: Self-pay | Admitting: Surgery

## 2015-12-28 ENCOUNTER — Ambulatory Visit
Admission: RE | Admit: 2015-12-28 | Discharge: 2015-12-28 | Disposition: A | Discharge status: Home / Self Care | Payer: BC Managed Care – PPO | Source: Ambulatory Visit | Attending: Surgery | Admitting: Surgery

## 2015-12-28 DIAGNOSIS — K5909 Other constipation: Secondary | ICD-10-CM

## 2015-12-30 ENCOUNTER — Other Ambulatory Visit: Payer: Self-pay | Admitting: Surgery

## 2015-12-30 DIAGNOSIS — K5909 Other constipation: Secondary | ICD-10-CM

## 2016-01-07 ENCOUNTER — Ambulatory Visit
Admission: RE | Admit: 2016-01-07 | Discharge: 2016-01-07 | Disposition: A | Discharge status: Home / Self Care | Payer: BC Managed Care – PPO | Source: Ambulatory Visit | Attending: Surgery | Admitting: Surgery

## 2016-01-07 DIAGNOSIS — K5909 Other constipation: Secondary | ICD-10-CM

## 2016-01-07 MED ORDER — IOPAMIDOL (ISOVUE-300) INJECTION 61%
100.0000 mL | Freq: Once | INTRAVENOUS | Status: AC | PRN
  Administered 2016-01-07: 100 mL via INTRAVENOUS

## 2016-01-08 ENCOUNTER — Encounter: Payer: Self-pay | Admitting: Internal Medicine

## 2016-01-08 NOTE — Progress Notes (Signed)
Quick Note:  Please call the patient and let them know that their scan shows only significant constipation, but no problems with the hernia or bowel. Send a copy to her PCP and let her know to request a GI referral.  ______

## 2016-02-08 ENCOUNTER — Encounter: Payer: Self-pay | Admitting: Family Medicine

## 2016-02-08 ENCOUNTER — Ambulatory Visit (INDEPENDENT_AMBULATORY_CARE_PROVIDER_SITE_OTHER): Payer: BC Managed Care – PPO | Admitting: Family Medicine

## 2016-02-08 VITALS — BP 143/79 | HR 80 | Temp 97.4°F | Ht 64.0 in | Wt 182.2 lb

## 2016-02-08 DIAGNOSIS — R21 Rash and other nonspecific skin eruption: Secondary | ICD-10-CM | POA: Diagnosis not present

## 2016-02-08 MED ORDER — VALACYCLOVIR HCL 1 G PO TABS: 1000.0000 mg | ORAL_TABLET | Freq: Three times a day (TID) | ORAL | 0 refills | Status: DC

## 2016-02-08 NOTE — Progress Notes (Signed)
   HPI  Patient presents today here with rash and shingles type burning pain.  Patient states that her last 5 to six-day she's had left-sided chest and left upper back burning type pain and mild erythema. She's worried about shingles.  She's previously had shingles on her right lower flank and states that the pain is very characteristic. She started omeprazole recently and wondered also think could be medication reaction.    PMH: Smoking status noted ROS: Per HPI  Objective: BP (!) 143/79   Pulse 80   Temp 97.4 F (36.3 C) (Oral)   Ht 5\' 4"  (1.626 m)   Wt 182 lb 3.2 oz (82.6 kg)   BMI 31.27 kg/m  Gen: NAD, alert, cooperative with exam HEENT: NCAT CV: RRR, good S1/S2, no murmur Resp: CTABL, no wheezes, non-labored Ext: No edema, warm Neuro: Alert and oriented, No gross deficits  Skin:  My CMA, Lorelee Cover, was present for exam Mild erythema of the left chest with a irregular linear distribution consistent and pain radiating around to the left upper back with no rash or vesicles, although consistent with dermatomal distribution  Assessment and plan:  # Rash Very mild erythema of the left upper chest, with typical neuropathic pain I have covered her for shingles. We discussed that this is possibly not shingles, however with low risk medication and her taking care of her 60-year-old grandson I think it's important to be aggressive and treat early in this case. Discussed supportive care and avoiding contact/minimizing contact with her grandson his 35-month-old. Valtrex RTC as needed   Meds ordered this encounter  Medications  . omeprazole (PRILOSEC) 20 MG capsule    Sig: Take by mouth.  . valACYclovir (VALTREX) 1000 MG tablet    Sig: Take 1 tablet (1,000 mg total) by mouth 3 (three) times daily.    Dispense:  21 tablet    Refill:  0    Murtis Sink, MD [facility] 02/08/2016, 8:31 AM

## 2016-02-08 NOTE — Patient Instructions (Signed)
Great to meet you!  Try to minimize contact with your grandchilld, Definitely practice good hand washing and wear clothes over the area of concern. Get him seen right away for any rash development.      Shingles Shingles is an infection that causes a painful skin rash and fluid-filled blisters. Shingles is caused by the same virus that causes chickenpox. Shingles only develops in people who:  Have had chickenpox.  Have gotten the chickenpox vaccine. (This is rare.) The first symptoms of shingles may be itching, tingling, or pain in an area on your skin. A rash will follow in a few days or weeks. The rash is usually on one side of the body in a bandlike or beltlike pattern. Over time, the rash turns into fluid-filled blisters that break open, scab over, and dry up. Medicines may:  Help you manage pain.  Help you recover more quickly.  Help to prevent long-term problems. HOME CARE Medicines  Take medicines only as told by your doctor.  Apply an anti-itch or numbing cream to the affected area as told by your doctor. Blister and Rash Care  Take a cool bath or put cool compresses on the area of the rash or blisters as told by your doctor. This may help with pain and itching.  Keep your rash covered with a loose bandage (dressing). Wear loose-fitting clothing.  Keep your rash and blisters clean with mild soap and cool water or as told by your doctor.  Check your rash every day for signs of infection. These include redness, swelling, and pain that lasts or gets worse.  Do not pick your blisters.  Do not scratch your rash. General Instructions  Rest as told by your doctor.  Keep all follow-up visits as told by your doctor. This is important.  Until your blisters scab over, your infection can cause chickenpox in people who have never had it or been vaccinated against it. To prevent this from happening, avoid touching other people or being around other people,  especially:  Babies.  Pregnant women.  Children who have eczema.  Elderly people who have transplants.  People who have chronic illnesses, such as leukemia or AIDS. GET HELP IF:  Your pain does not get better with medicine.  Your pain does not get better after the rash heals.  Your rash looks infected. Signs of infection include:  Redness.  Swelling.  Pain that lasts or gets worse. GET HELP RIGHT AWAY IF:  The rash is on your face or nose.  You have pain in your face, pain around your eye area, or loss of feeling on one side of your face.  You have ear pain or you have ringing in your ear.  You have loss of taste.  Your condition gets worse.   This information is not intended to replace advice given to you by your health care provider. Make sure you discuss any questions you have with your health care provider.   Document Released: 12/21/2007 Document Revised: 07/25/2014 Document Reviewed: 04/15/2014 Elsevier Interactive Patient Education [employer]

## 2016-02-17 ENCOUNTER — Ambulatory Visit: Payer: BC Managed Care – PPO | Admitting: Nurse Practitioner

## 2016-03-28 ENCOUNTER — Telehealth: Payer: Self-pay | Admitting: Family Medicine

## 2016-03-28 NOTE — Telephone Encounter (Signed)
Needs to be seen.   Murtis SinkSam Bradshaw, MD [facility] 03/28/2016, 10:34 AM

## 2016-03-28 NOTE — Telephone Encounter (Signed)
Notified patient. 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 Offered an appt in the night clinic and patient said she would see if she could work it out and call us back and let us know.

## 2016-03-28 NOTE — Telephone Encounter (Signed)
Please advise and route to pool A 

## 2016-04-21 ENCOUNTER — Telehealth: Payer: Self-pay | Admitting: Family Medicine

## 2016-04-21 ENCOUNTER — Other Ambulatory Visit: Payer: Self-pay | Admitting: Nurse Practitioner

## 2016-04-21 NOTE — Telephone Encounter (Signed)
LVOVM will NTBS before refill could be done

## 2016-04-21 NOTE — Telephone Encounter (Signed)
Patient needs to come into have an appointment for her anxiety and see me for this issue prior to refills

## 2016-04-21 NOTE — Telephone Encounter (Signed)
Last chronic follow up was in 09/2015 with dettinger - please address

## 2016-04-21 NOTE — Telephone Encounter (Signed)
Patient needs an appointment

## 2016-04-22 NOTE — Telephone Encounter (Signed)
Pt has appt w/ Dr. Christell ConstantMoore for 11/1.

## 2016-05-18 ENCOUNTER — Ambulatory Visit: Payer: BC Managed Care – PPO | Admitting: Family Medicine

## 2016-05-20 ENCOUNTER — Ambulatory Visit (INDEPENDENT_AMBULATORY_CARE_PROVIDER_SITE_OTHER): Payer: BC Managed Care – PPO | Admitting: Family Medicine

## 2016-05-20 ENCOUNTER — Encounter: Payer: Self-pay | Admitting: Family Medicine

## 2016-05-20 VITALS — BP 122/78 | HR 71 | Temp 98.3°F | Ht 64.0 in | Wt 174.6 lb

## 2016-05-20 DIAGNOSIS — K279 Peptic ulcer, site unspecified, unspecified as acute or chronic, without hemorrhage or perforation: Secondary | ICD-10-CM | POA: Diagnosis not present

## 2016-05-20 DIAGNOSIS — K2100 Gastro-esophageal reflux disease with esophagitis, without bleeding: Secondary | ICD-10-CM

## 2016-05-20 DIAGNOSIS — K219 Gastro-esophageal reflux disease without esophagitis: Secondary | ICD-10-CM

## 2016-05-20 DIAGNOSIS — F411 Generalized anxiety disorder: Secondary | ICD-10-CM | POA: Insufficient documentation

## 2016-05-20 HISTORY — DX: Generalized anxiety disorder: F41.1

## 2016-05-20 HISTORY — DX: Peptic ulcer, site unspecified, unspecified as acute or chronic, without hemorrhage or perforation: K27.9

## 2016-05-20 MED ORDER — OMEPRAZOLE 20 MG PO CPDR: 20.0000 mg | DELAYED_RELEASE_CAPSULE | Freq: Every day | ORAL | 3 refills | Status: DC

## 2016-05-20 MED ORDER — ALPRAZOLAM 0.5 MG PO TABS: 0.5000 mg | ORAL_TABLET | Freq: Two times a day (BID) | ORAL | 1 refills | Status: DC | PRN

## 2016-05-20 MED ORDER — PAROXETINE HCL 20 MG PO TABS: 20.0000 mg | ORAL_TABLET | Freq: Every day | ORAL | 3 refills | Status: DC

## 2016-05-20 NOTE — Progress Notes (Signed)
   HPI  Patient presents today here to discuss anxiety, GERD, and peptic ulcer disease.  Patient had an EGD in July 2017 showing a gastric ulcer and esophagitis. She was started on twice daily PPI and stopped taking that one week ago. She has had recurrent GERD symptoms with "burning and raw feeling in her throat". She denies any significant heartburn.  Anxiety Patient is taking approximately 1 Xanax daily, sometimes for sleep, sometimes for anxiety. She is willing to start controller medication She denies suicidal thoughts or depression.    PMH: Smoking status noted ROS: Per HPI  Objective: BP 122/78   Pulse 71   Temp 98.3 F (36.8 C) (Oral)   Ht _0  (1.626 m)   Wt 174 lb 9.6 oz (79.2 kg)   BMI 29.97 kg/m  Gen: NAD, alert, cooperative with exam HEENT: NCAT CV: RRR, good S1/S2, no murmur Resp: CTABL, no wheezes, non-labored Ext: No edema, warm Neuro: Alert and oriented, No gross deficits  Assessment and plan:  # Generalized anxiety disorder Stable Refilled Xanax, approximately 1 pill once daily currently being taken Starting Paxil as a controller medication, follow-up 3-4 weeks  # GERD With esophagitis found on the EGD in July Patient just finished her 3 month course of twice daily PPI, restart once daily PPI with recurrence of symptoms after 1 week stopped Check in with GI  # Peptic ulcer disease Patient with history of peptic ulcer, she has just completed 3 months of twice daily PPI, would consider this treated at this point, however started PPI again due to recurrent GERD symptoms.   Orders Placed This Encounter  Procedures  . CMP14+EGFR    Meds ordered this encounter  Medications  . ALPRAZolam (XANAX) 0.5 MG tablet    Sig: Take 1 tablet (0.5 mg total) by mouth 2 (two) times daily as needed.    Dispense:  90 tablet    Refill:  1  . omeprazole (PRILOSEC) 20 MG capsule    Sig: Take 1 capsule (20 mg total) by mouth daily.    Dispense:  90 capsule   Refill:  3  . PARoxetine (PAXIL) 20 MG tablet    Sig: Take 1 tablet (20 mg total) by mouth daily.    Dispense:  30 tablet    Refill:  [place], MD [facility] 05/20/2016, 9:26 AM

## 2016-05-20 NOTE — Patient Instructions (Addendum)
Great to see you!  I have started you back on omeprazole  I have started you on paxil for anxiety, start with 1/2 pill once daily for 2 weeks then increase to 1 pill daily.   Come back in 3-4 weeks to follow up for anxiety

## 2016-05-21 LAB — CMP14+EGFR
A/G RATIO: 2 (ref 1.2–2.2)
ALK PHOS: 96 IU/L (ref 39–117)
ALT: 21 IU/L (ref 0–32)
AST: 19 IU/L (ref 0–40)
Albumin: 4.3 g/dL (ref 3.6–4.8)
BILIRUBIN TOTAL: 0.8 mg/dL (ref 0.0–1.2)
BUN/Creatinine Ratio: 15 (ref 12–28)
BUN: 13 mg/dL (ref 8–27)
CHLORIDE: 103 mmol/L (ref 96–106)
CO2: 26 mmol/L (ref 18–29)
Calcium: 9.4 mg/dL (ref 8.7–10.3)
Creatinine, Ser: 0.89 mg/dL (ref 0.57–1.00)
GFR calc Af Amer: 81 mL/min/{1.73_m2} (ref >59)
GFR calc non Af Amer: 71 mL/min/{1.73_m2} (ref >59)
Globulin, Total: 2.2 g/dL (ref 1.5–4.5)
Glucose: 96 mg/dL (ref 65–99)
POTASSIUM: 4.5 mmol/L (ref 3.5–5.2)
Sodium: 143 mmol/L (ref 134–144)
Total Protein: 6.5 g/dL (ref 6.0–8.5)

## 2016-08-18 ENCOUNTER — Ambulatory Visit (INDEPENDENT_AMBULATORY_CARE_PROVIDER_SITE_OTHER): Payer: BC Managed Care – PPO | Admitting: Family Medicine

## 2016-08-18 ENCOUNTER — Encounter: Payer: Self-pay | Admitting: Family Medicine

## 2016-08-18 VITALS — BP 128/73 | HR 87 | Temp 97.8°F | Ht 64.0 in | Wt 180.0 lb

## 2016-08-18 DIAGNOSIS — M75102 Unspecified rotator cuff tear or rupture of left shoulder, not specified as traumatic: Secondary | ICD-10-CM

## 2016-08-18 NOTE — Progress Notes (Signed)
   HPI  Patient presents today here with left shoulder pain.  Patient explains that 2 weeks ago she was helping move a hot water heater, 2 days after she moved that she began having left shoulder pain that was very severe. It hurts worse with movement. She's been doing some exercises she learned from her mothers rotator cuff surgery.  Her disease and has avoided NSAIDs appropriately Tylenol is not helping.  Icy hot was a little bit helpful.  She describes the pain as upper shoulder pain with severe shooting pains up to her neck.  PMH: Smoking status noted ROS: Per HPI  Objective: BP 128/73   Pulse 87   Temp 97.8 F (36.6 C) (Oral)   Ht 5\' 4"  (1.626 m)   Wt 180 lb (81.6 kg)   BMI 30.90 kg/m  Gen: NAD, alert, cooperative with exam HEENT: NCAT, EOMI, PERRL CV: RRR Resp: CTABL, no wheezes, non-labored Ext: No edema, warm Neuro: Alert and oriented, No gross deficits  Msk:  Tenderness to palpation over the supraspinatus and deltoid Mildly limited range of motion Positive Hawkins, Neer's, and empty can test with good preserved strength.    Informed consent obtained and placed in chart.  Time out performed.  Area cleaned with iodine x 2 and wiped clear with alcohol swab.  Using 21 1/2 gauge needle 1 cc Kenalog and 3 cc's 2% xylocaine were injected in subacromial space via posterior approach.  Sterile bandage placed.  Patient tolerated procedure well.  No complications.     Assessment and plan:  # rotator cuff Syndrome Subacromial injection of kenalog today Exercises per sports medicine patient advisor Doubt full thickness tear with good strength, most likely small tear or strain    Murtis SinkSam Gurpreet Mariani, MD [facility] 08/18/2016, 9:42 AM

## 2016-08-18 NOTE — Patient Instructions (Signed)
Great to see you!  Come back in 3-4 months or sooner if needed.

## 2016-09-14 ENCOUNTER — Encounter: Payer: BC Managed Care – PPO | Admitting: *Deleted

## 2016-09-26 ENCOUNTER — Encounter: Payer: BC Managed Care – PPO | Admitting: *Deleted

## 2016-10-06 ENCOUNTER — Ambulatory Visit (INDEPENDENT_AMBULATORY_CARE_PROVIDER_SITE_OTHER): Payer: BC Managed Care – PPO | Admitting: Family Medicine

## 2016-10-06 ENCOUNTER — Encounter: Payer: Self-pay | Admitting: Family Medicine

## 2016-10-06 VITALS — BP 175/83 | HR 76 | Temp 97.3°F | Ht 64.0 in | Wt 175.2 lb

## 2016-10-06 DIAGNOSIS — H9201 Otalgia, right ear: Secondary | ICD-10-CM | POA: Diagnosis not present

## 2016-10-06 DIAGNOSIS — R030 Elevated blood-pressure reading, without diagnosis of hypertension: Secondary | ICD-10-CM

## 2016-10-06 DIAGNOSIS — H6123 Impacted cerumen, bilateral: Secondary | ICD-10-CM

## 2016-10-06 NOTE — Progress Notes (Signed)
   HPI  Patient presents today with ear pain and elevated blood pressure.  Patient states that she was seen for follow-up appointment at general surgery earlier this week, her blood pressure was measured at 163/74 there. She has been having mild headache throughout this week. She states that she developed right ear pain before all these symptoms also started, she describes dull achy right ear pain not associated with eating, no change in hearing.   PMH: Smoking status noted ROS: Per HPI  Objective: BP (!) 175/83   Pulse 76   Temp 97.3 F (36.3 C) (Oral)   Ht 5\' 4"  (1.626 m)   Wt 175 lb 3.2 oz (79.5 kg)   BMI 30.07 kg/m  Gen: NAD, alert, cooperative with exam HEENT: NCAT, bilateral TMs obscured by cerumen, external canal within normal limits bilaterally - After irrigations BL Tms WNL, cerumen removed completely CV: RRR, good S1/S2, no murmur Resp: CTABL, no wheezes, non-labored Ext: No edema, warm Neuro: Alert and oriented, No gross deficits  Assessment and plan:  # Right ear pain, bilateral impacted cerumen Pt with good relief from pain after irrigation Discussed no need for q tips  # Elevated blood pressure without diagnosis of hypertension Blood pressure log given, follow-up in 2-4 weeks We'll start medication if persistently elevated, she is concerned, and I agree, that her blood pressure could be elevated due to the ear pain.     Murtis SinkSam Norrin Shreffler, MD [facility] 10/06/2016, 11:05 AM

## 2016-10-06 NOTE — Patient Instructions (Signed)
Great to see you!  Lets follow up in 2-4 weeks, bring your blood pressure log.

## 2016-10-07 ENCOUNTER — Telehealth: Payer: Self-pay | Admitting: Family Medicine

## 2016-10-07 MED ORDER — AMOXICILLIN 500 MG PO CAPS: 500.0000 mg | ORAL_CAPSULE | Freq: Three times a day (TID) | ORAL | 0 refills | Status: DC

## 2016-10-07 NOTE — Telephone Encounter (Signed)
What is the name of the medication? antibotic   Have you contacted your pharmacy to request a refill?no...seen yesterday to have ears clean.. Feeling fluid in ears  Which pharmacy [facility] you [facility] [place]   Patient notified that their request is being sent to the clinical staff for review and that they should receive a call once it is complete. If they do not receive a call within 24 hours they can check with their pharmacy or our office.

## 2016-10-07 NOTE — Telephone Encounter (Signed)
Forward to Dr. B

## 2016-10-07 NOTE — Telephone Encounter (Signed)
Amox sent to her pharmacy.   Murtis SinkSam Yevonne Yokum, MD [facility] 10/07/2016, 2:57 PM

## 2016-10-07 NOTE — Telephone Encounter (Signed)
[facility] aware [facility]

## 2016-10-07 NOTE — Telephone Encounter (Signed)
Patient is having worsening ear pain, pressure and fullness in her ears since being seen yesterday.  She would like to know if an antibiotic could be sent in for her.  Please advise.

## 2016-10-22 ENCOUNTER — Telehealth: Payer: Self-pay | Admitting: Family Medicine

## 2016-10-22 NOTE — Telephone Encounter (Signed)
Can try mouth wash, gargling. Eat gentle foods for the next few days. Sometimes antibiotics can cause stomach upset.

## 2016-10-22 NOTE — Telephone Encounter (Signed)
Patient states that she finished the amoxicillin yesterday and she has a nasty after taste left in her mouth and she states that it is a real nasty taste.

## 2016-10-24 NOTE — Telephone Encounter (Signed)
Patient aware of Dr. Vincent's recommendations  

## 2016-11-01 ENCOUNTER — Ambulatory Visit: Payer: BC Managed Care – PPO | Admitting: Family Medicine

## 2016-11-03 ENCOUNTER — Encounter: Payer: Self-pay | Admitting: Family Medicine

## 2016-11-03 ENCOUNTER — Ambulatory Visit (INDEPENDENT_AMBULATORY_CARE_PROVIDER_SITE_OTHER): Payer: BC Managed Care – PPO | Admitting: Family Medicine

## 2016-11-03 VITALS — BP 136/74 | HR 64 | Temp 97.3°F | Ht 64.0 in | Wt 177.4 lb

## 2016-11-03 DIAGNOSIS — R030 Elevated blood-pressure reading, without diagnosis of hypertension: Secondary | ICD-10-CM | POA: Diagnosis not present

## 2016-11-03 DIAGNOSIS — J301 Allergic rhinitis due to pollen: Secondary | ICD-10-CM

## 2016-11-03 DIAGNOSIS — E669 Obesity, unspecified: Secondary | ICD-10-CM

## 2016-11-03 NOTE — Patient Instructions (Addendum)
Great to see you!  Come back in 6 months unless you need [name] sooner.   Consider starting a daily zyrtec ( certirizine), if this I snot completely effective continue and add flonase 2 sprays per nostril once daily

## 2016-11-03 NOTE — Progress Notes (Signed)
   HPI  Patient presents today here to follow-up for elevated blood pressure, weight, and allergies.  Blood pressure Patient provides log at home blood pressure is usually 120s and 130s over 70s. She's had one reading at 144/77 Patient has been eating a low-salt diet.  Obesity Patient is having difficulty with her diet, she is frustrated with weight loss would like to make sure that there is no lab abnormalities that would keep her from losing weight.  Allergies Patient with sinus congestion, frequent throat clearing, and sinus pressure at times. Using over-the-counter allergy medication, unaware of which one.   PMH: Smoking status noted ROS: Per HPI  Objective: BP 136/74   Pulse 64   Temp 97.3 F (36.3 C) (Oral)   Ht _0  (1.626 m)   Wt 177 lb 6.4 oz (80.5 kg)   BMI 30.45 kg/m  Gen: NAD, alert, cooperative with exam HEENT: NCAT, nares with very swollen turbinates, TMs with small amount of fluid but no erythema or loss of landmarks bilaterally CV: RRR, good S1/S2, no murmur Resp: CTABL, no wheezes, non-labored Ext: No edema, warm Neuro: Alert and oriented, No gross deficits  Assessment and plan:  # Elevated blood pressure without diagnosis of hypertension Blood pressure log reveals good blood pressures, no need for medications.   # Obesity Difficulty losing weight Labs including cholesterol and TSH  # Seasonal allergies Tried daily Zyrtec, add Flonase daily if needed. Exam consistent with allergic rhinitis and eustachian tube dysfunction  Orders Placed This Encounter  Procedures  . Lipid panel  . CMP14+EGFR  . CBC with Differential/Platelet  . TSH    Laroy Apple, MD [facility] 11/03/2016, 8:29 AM

## 2016-11-04 LAB — CBC WITH DIFFERENTIAL/PLATELET
BASOS ABS: 0 10*3/uL (ref 0.0–0.2)
Basos: 0 %
EOS (ABSOLUTE): 0 10*3/uL (ref 0.0–0.4)
Eos: 1 %
HEMOGLOBIN: 14.3 g/dL (ref 11.1–15.9)
Hematocrit: 39.2 % (ref 34.0–46.6)
Immature Grans (Abs): 0 10*3/uL (ref 0.0–0.1)
Immature Granulocytes: 0 %
Lymphocytes Absolute: 1.5 10*3/uL (ref 0.7–3.1)
Lymphs: 30 %
MCH: 31.2 pg (ref 26.6–33.0)
MCHC: 36.5 g/dL — AB (ref 31.5–35.7)
MCV: 85 fL (ref 79–97)
Monocytes Absolute: 0.2 10*3/uL (ref 0.1–0.9)
Monocytes: 5 %
NEUTROS ABS: 3.2 10*3/uL (ref 1.4–7.0)
Neutrophils: 64 %
Platelets: 225 10*3/uL (ref 150–379)
RBC: 4.59 x10E6/uL (ref 3.77–5.28)
RDW: 12.2 % — ABNORMAL LOW (ref 12.3–15.4)
WBC: 4.9 10*3/uL (ref 3.4–10.8)

## 2016-11-04 LAB — CMP14+EGFR
ALBUMIN: 4.2 g/dL (ref 3.6–4.8)
ALK PHOS: 84 IU/L (ref 39–117)
ALT: 22 IU/L (ref 0–32)
AST: 25 IU/L (ref 0–40)
Albumin/Globulin Ratio: 2 (ref 1.2–2.2)
BILIRUBIN TOTAL: 0.6 mg/dL (ref 0.0–1.2)
BUN / CREAT RATIO: 19 (ref 12–28)
BUN: 14 mg/dL (ref 8–27)
CHLORIDE: 103 mmol/L (ref 96–106)
CO2: 25 mmol/L (ref 18–29)
Calcium: 9 mg/dL (ref 8.7–10.3)
Creatinine, Ser: 0.75 mg/dL (ref 0.57–1.00)
GFR calc non Af Amer: 87 mL/min/{1.73_m2} (ref >59)
GFR, EST AFRICAN AMERICAN: 100 mL/min/{1.73_m2} (ref >59)
GLUCOSE: 100 mg/dL — AB (ref 65–99)
Globulin, Total: 2.1 g/dL (ref 1.5–4.5)
POTASSIUM: 4.3 mmol/L (ref 3.5–5.2)
Sodium: 144 mmol/L (ref 134–144)
TOTAL PROTEIN: 6.3 g/dL (ref 6.0–8.5)

## 2016-11-04 LAB — LIPID PANEL
CHOLESTEROL TOTAL: 166 mg/dL (ref 100–199)
Chol/HDL Ratio: 2.2 ratio (ref 0.0–4.4)
HDL: 75 mg/dL (ref >39)
LDL CALC: 79 mg/dL (ref 0–99)
Triglycerides: 60 mg/dL (ref 0–149)
VLDL Cholesterol Cal: 12 mg/dL (ref 5–40)

## 2016-11-04 LAB — TSH: TSH: 1.41 u[IU]/mL (ref 0.450–4.500)

## 2016-11-08 ENCOUNTER — Ambulatory Visit (INDEPENDENT_AMBULATORY_CARE_PROVIDER_SITE_OTHER): Payer: BC Managed Care – PPO | Admitting: *Deleted

## 2016-11-08 DIAGNOSIS — Z23 Encounter for immunization: Secondary | ICD-10-CM

## 2016-11-08 DIAGNOSIS — Z111 Encounter for screening for respiratory tuberculosis: Secondary | ICD-10-CM

## 2016-11-08 NOTE — Progress Notes (Signed)
PPD placed R forearm Pt tolerated well 

## 2016-11-10 LAB — TB SKIN TEST
Induration: 0 mm
TB SKIN TEST: NEGATIVE

## 2016-11-22 ENCOUNTER — Other Ambulatory Visit: Payer: Self-pay | Admitting: Family Medicine

## 2016-11-22 NOTE — Telephone Encounter (Signed)
Covering PCP, please advise.  

## 2016-12-02 ENCOUNTER — Other Ambulatory Visit (HOSPITAL_COMMUNITY): Payer: Self-pay | Admitting: Surgery

## 2016-12-02 ENCOUNTER — Ambulatory Visit (HOSPITAL_COMMUNITY): Payer: BC Managed Care – PPO

## 2016-12-02 DIAGNOSIS — R1012 Left upper quadrant pain: Secondary | ICD-10-CM

## 2016-12-06 ENCOUNTER — Ambulatory Visit (HOSPITAL_COMMUNITY): Payer: BC Managed Care – PPO

## 2016-12-08 ENCOUNTER — Other Ambulatory Visit: Payer: Self-pay | Admitting: Family Medicine

## 2016-12-08 DIAGNOSIS — R140 Abdominal distension (gaseous): Secondary | ICD-10-CM

## 2017-02-20 ENCOUNTER — Other Ambulatory Visit: Payer: Self-pay | Admitting: Physician Assistant

## 2017-02-20 NOTE — Telephone Encounter (Signed)
Last seen 11/03/16  Dr Ermalinda MemosBradshaw  If approved [facility]

## 2017-02-20 NOTE — Telephone Encounter (Signed)
Note BID _ called in #60 - phoned to pharm

## 2017-04-19 ENCOUNTER — Telehealth: Payer: Self-pay | Admitting: Family Medicine

## 2017-04-19 ENCOUNTER — Telehealth: Payer: Self-pay | Admitting: *Deleted

## 2017-04-19 ENCOUNTER — Encounter (HOSPITAL_COMMUNITY): Payer: Self-pay | Admitting: *Deleted

## 2017-04-19 ENCOUNTER — Emergency Department (HOSPITAL_COMMUNITY)
Admission: EM | Admit: 2017-04-19 | Discharge: 2017-04-19 | Disposition: A | Discharge status: Home / Self Care | Payer: BC Managed Care – PPO | Attending: Emergency Medicine | Admitting: Emergency Medicine

## 2017-04-19 DIAGNOSIS — K625 Hemorrhage of anus and rectum: Secondary | ICD-10-CM | POA: Diagnosis present

## 2017-04-19 DIAGNOSIS — Z5321 Procedure and treatment not carried out due to patient leaving prior to being seen by health care provider: Secondary | ICD-10-CM | POA: Diagnosis not present

## 2017-04-19 LAB — COMPREHENSIVE METABOLIC PANEL
ALK PHOS: 82 U/L (ref 38–126)
ALT: 36 U/L (ref 14–54)
ANION GAP: 10 (ref 5–15)
AST: 36 U/L (ref 15–41)
Albumin: 4.1 g/dL (ref 3.5–5.0)
BUN: 17 mg/dL (ref 6–20)
CALCIUM: 9 mg/dL (ref 8.9–10.3)
CO2: 25 mmol/L (ref 22–32)
Chloride: 106 mmol/L (ref 101–111)
Creatinine, Ser: 0.85 mg/dL (ref 0.44–1.00)
GFR calc non Af Amer: >60 mL/min (ref >60)
Glucose, Bld: 127 mg/dL — ABNORMAL HIGH (ref 65–99)
Potassium: 4.1 mmol/L (ref 3.5–5.1)
SODIUM: 141 mmol/L (ref 135–145)
TOTAL PROTEIN: 6.4 g/dL — AB (ref 6.5–8.1)
Total Bilirubin: 0.8 mg/dL (ref 0.3–1.2)

## 2017-04-19 LAB — URINALYSIS, ROUTINE W REFLEX MICROSCOPIC
Bilirubin Urine: NEGATIVE
Glucose, UA: NEGATIVE mg/dL
Hgb urine dipstick: NEGATIVE
Ketones, ur: NEGATIVE mg/dL
LEUKOCYTES UA: NEGATIVE
NITRITE: NEGATIVE
Protein, ur: NEGATIVE mg/dL
SPECIFIC GRAVITY, URINE: 1.023 (ref 1.005–1.030)
pH: 7 (ref 5.0–8.0)

## 2017-04-19 LAB — CBC
HCT: 40.8 % (ref 36.0–46.0)
HEMOGLOBIN: 13.5 g/dL (ref 12.0–15.0)
MCH: 29.2 pg (ref 26.0–34.0)
MCHC: 33.1 g/dL (ref 30.0–36.0)
MCV: 88.3 fL (ref 78.0–100.0)
Platelets: 225 10*3/uL (ref 150–400)
RBC: 4.62 MIL/uL (ref 3.87–5.11)
RDW: 12.4 % (ref 11.5–15.5)
WBC: 11.4 10*3/uL — ABNORMAL HIGH (ref 4.0–10.5)

## 2017-04-19 LAB — LIPASE, BLOOD: Lipase: 34 U/L (ref 11–51)

## 2017-04-19 NOTE — ED Triage Notes (Signed)
The pt is c/o abd pain  She was at work today when she felt SICK    She went ot br aroound 1230pm and had bright red blood from her rectum  No previous history  She denies constipation or hemorrhoids

## 2017-04-19 NOTE — Telephone Encounter (Signed)
What symptoms do you have? Bowel problems, bleeding and feels like passing out when she has to use the bathroon  How long have you been sick? Lunch time today  Have you been seen for this problem? no  If your provider decides to give you a prescription, which pharmacy would you like for it to be sent to? Wants to know what to do   Patient informed that this information will be sent to the clinical staff for review and that they should receive a follow up call.

## 2017-04-19 NOTE — Telephone Encounter (Signed)
Pt called with c/o severe abdominal pain Near syncopal episode when she went to the bathroom Pt passing blood Instructed pt to go to the ER Verbalizes understanding

## 2017-04-20 ENCOUNTER — Ambulatory Visit (INDEPENDENT_AMBULATORY_CARE_PROVIDER_SITE_OTHER): Payer: BC Managed Care – PPO | Admitting: Family Medicine

## 2017-04-20 ENCOUNTER — Encounter: Payer: Self-pay | Admitting: Family Medicine

## 2017-04-20 VITALS — BP 139/75 | HR 76 | Temp 98.3°F | Ht 60.5 in | Wt 185.0 lb

## 2017-04-20 DIAGNOSIS — K5792 Diverticulitis of intestine, part unspecified, without perforation or abscess without bleeding: Secondary | ICD-10-CM

## 2017-04-20 MED ORDER — METRONIDAZOLE 500 MG PO TABS: 500.0000 mg | ORAL_TABLET | Freq: Two times a day (BID) | ORAL | 0 refills | Status: DC

## 2017-04-20 MED ORDER — CIPROFLOXACIN HCL 500 MG PO TABS: 500.0000 mg | ORAL_TABLET | Freq: Two times a day (BID) | ORAL | 0 refills | Status: DC

## 2017-04-20 NOTE — Progress Notes (Addendum)
BP 139/75   Pulse 76   Temp 98.3 F (36.8 C) (Oral)   Ht 5' 0.5" (1.537 m)   Wt 185 lb (83.9 kg)   BMI 35.54 kg/m    Subjective:    Patient ID: Rita Diaz, female    DOB: 1956/02/24, 61 y.o.   MRN: [id number]  HPI: Rita Diaz is a 61 y.o. female presenting on 04/20/2017 for Rectal Bleeding and pain (began yesterday as dark blood buit is now bright red; went to hospital last night, blood was drawn but she left after waiting for several hours)   HPI Blood in stool and abdominal pain Patient comes in with complaints of blood in stool and diarrhea and lower abdominal pain that started yesterday. She says the blood started on low little bit darker and then came out bright red. She did go to the emergency department had some blood drawn yesterday but was not seen because the weight was greater than 7 hours. She denies any fevers but has had some chills since yesterday. She says the pain is moderate in severity and sharp and in her left lower abdomen and does not radiate anywhere else. She does also have some rectal soreness from going so frequently since yesterday.she did have an elevated white count yesterday in the hospital but everything else was within normal limits.  Relevant past medical, surgical, family and social history reviewed and updated as indicated. Interim medical history since our last visit reviewed. Allergies and medications reviewed and updated.  Review of Systems  Constitutional: Negative for chills and fever.  Eyes: Negative for visual disturbance.  Respiratory: Negative for chest tightness and shortness of breath.   Cardiovascular: Negative for chest pain and leg swelling.  Gastrointestinal: Positive for abdominal pain, blood in stool and diarrhea. Negative for nausea and vomiting.  Genitourinary: Negative for dysuria.  Musculoskeletal: Negative for back pain and gait problem.  Skin: Negative for rash.  Neurological: Negative for light-headedness and  headaches.  Psychiatric/Behavioral: Negative for agitation and behavioral problems.  All other systems reviewed and are negative.   Per HPI unless specifically indicated above      Objective:    BP 139/75   Pulse 76   Temp 98.3 F (36.8 C) (Oral)   Ht 5' 0.5" (1.537 m)   Wt 185 lb (83.9 kg)   BMI 35.54 kg/m   Wt Readings from Last 3 Encounters:  04/20/17 185 lb (83.9 kg)  04/19/17 186 lb (84.4 kg)  11/03/16 177 lb 6.4 oz (80.5 kg)    Physical Exam  Constitutional: She is oriented to person, place, and time. She appears well-developed and well-nourished. No distress.  Eyes: Conjunctivae are normal.  Cardiovascular: Normal rate, regular rhythm, normal heart sounds and intact distal pulses.   No murmur heard. Pulmonary/Chest: Effort normal and breath sounds normal. No respiratory distress. She has no wheezes. She has no rales.  Abdominal: Soft. Bowel sounds are normal. She exhibits no distension. There is tenderness in the left lower quadrant. There is no rigidity, no rebound, no guarding and no CVA tenderness.  Genitourinary: Rectal exam shows tenderness (no fissure or hemorrhoid noted) and guaiac positive stool.  Musculoskeletal: Normal range of motion.  Neurological: She is alert and oriented to person, place, and time. Coordination normal.  Skin: Skin is warm and dry. No rash noted. She is not diaphoretic.  Psychiatric: She has a normal mood and affect. Her behavior is normal.  Nursing note and vitals reviewed.   Results for  orders placed or performed during the hospital encounter of 04/19/17  Lipase, blood  Result Value Ref Range   Lipase 34 11 - 51 U/L  Comprehensive metabolic panel  Result Value Ref Range   Sodium 141 135 - 145 mmol/L   Potassium 4.1 3.5 - 5.1 mmol/L   Chloride 106 101 - 111 mmol/L   CO2 25 22 - 32 mmol/L   Glucose, Bld 127 (H) 65 - 99 mg/dL   BUN 17 6 - 20 mg/dL   Creatinine, Ser 8.46 0.44 - 1.00 mg/dL   Calcium 9.0 8.9 - 96.2 mg/dL   Total  Protein 6.4 (L) 6.5 - 8.1 g/dL   Albumin 4.1 3.5 - 5.0 g/dL   AST 36 15 - 41 U/L   ALT 36 14 - 54 U/L   Alkaline Phosphatase 82 38 - 126 U/L   Total Bilirubin 0.8 0.3 - 1.2 mg/dL   GFR calc non Af Amer >60 >60 mL/min   GFR calc Af Amer >60 >60 mL/min   Anion gap 10 5 - 15  CBC  Result Value Ref Range   WBC 11.4 (H) 4.0 - 10.5 K/uL   RBC 4.62 3.87 - 5.11 MIL/uL   Hemoglobin 13.5 12.0 - 15.0 g/dL   HCT 95.2 84.1 - 32.4 %   MCV 88.3 78.0 - 100.0 fL   MCH 29.2 26.0 - 34.0 pg   MCHC 33.1 30.0 - 36.0 g/dL   RDW 40.1 02.7 - 25.3 %   Platelets 225 150 - 400 K/uL  Urinalysis, Routine w reflex microscopic  Result Value Ref Range   Color, Urine YELLOW YELLOW   APPearance CLEAR CLEAR   Specific Gravity, Urine 1.023 1.005 - 1.030   pH 7.0 5.0 - 8.0   Glucose, UA NEGATIVE NEGATIVE mg/dL   Hgb urine dipstick NEGATIVE NEGATIVE   Bilirubin Urine NEGATIVE NEGATIVE   Ketones, ur NEGATIVE NEGATIVE mg/dL   Protein, ur NEGATIVE NEGATIVE mg/dL   Nitrite NEGATIVE NEGATIVE   Leukocytes, UA NEGATIVE NEGATIVE   Guaiac positive    Assessment & Plan:   Problem List Items Addressed This Visit    None    Visit Diagnoses    Diverticulitis    -  Primary   Relevant Medications   ciprofloxacin (CIPRO) 500 MG tablet   metroNIDAZOLE (FLAGYL) 500 MG tablet      Follow up plan: Return if symptoms worsen or fail to improve.  Counseling provided for all of the vaccine components No orders of the defined types were placed in this encounter.   Arville Care, MD [facility] 04/20/2017, 8:19 AM

## 2017-04-20 NOTE — Telephone Encounter (Signed)
Needs to be seen, here, UC , or ED ( if persistent bloody stools and weak feeling)  Murtis Sink, MD [facility] 04/20/2017, 2:47 PM

## 2017-04-20 NOTE — Telephone Encounter (Signed)
Pt was seen here today

## 2017-04-20 NOTE — Telephone Encounter (Signed)
Please advise 

## 2017-04-21 ENCOUNTER — Other Ambulatory Visit: Payer: Self-pay | Admitting: Surgery

## 2017-04-21 DIAGNOSIS — R1012 Left upper quadrant pain: Secondary | ICD-10-CM

## 2017-04-24 ENCOUNTER — Telehealth: Payer: Self-pay | Admitting: Family Medicine

## 2017-04-24 NOTE — Telephone Encounter (Signed)
Pt asking about RXs for antibiotics Dose explained to pt Verbalizes understanding

## 2017-04-25 ENCOUNTER — Telehealth: Payer: Self-pay | Admitting: Family Medicine

## 2017-04-26 NOTE — Telephone Encounter (Signed)
lmtcb

## 2017-04-28 ENCOUNTER — Ambulatory Visit
Admission: RE | Admit: 2017-04-28 | Discharge: 2017-04-28 | Disposition: A | Discharge status: Home / Self Care | Payer: BC Managed Care – PPO | Source: Ambulatory Visit | Attending: Surgery | Admitting: Surgery

## 2017-04-28 DIAGNOSIS — R1012 Left upper quadrant pain: Secondary | ICD-10-CM

## 2017-04-28 MED ORDER — IOPAMIDOL (ISOVUE-300) INJECTION 61%: 100.0000 mL | Freq: Once | INTRAVENOUS | Status: DC | PRN

## 2017-04-28 NOTE — Progress Notes (Signed)
Please call the patient and let them know that their scan showed only some constipation, but no sign of hernia or other problems in the abdomen

## 2017-05-01 ENCOUNTER — Telehealth: Payer: Self-pay | Admitting: Family Medicine

## 2017-05-03 NOTE — Telephone Encounter (Signed)
Refer to other phone note . 

## 2017-05-11 ENCOUNTER — Encounter: Payer: Self-pay | Admitting: Family Medicine

## 2017-05-11 ENCOUNTER — Ambulatory Visit (INDEPENDENT_AMBULATORY_CARE_PROVIDER_SITE_OTHER): Payer: BC Managed Care – PPO | Admitting: Family Medicine

## 2017-05-11 VITALS — BP 133/81 | HR 73 | Temp 97.3°F | Ht 60.5 in | Wt 182.0 lb

## 2017-05-11 DIAGNOSIS — L918 Other hypertrophic disorders of the skin: Secondary | ICD-10-CM

## 2017-05-11 DIAGNOSIS — F411 Generalized anxiety disorder: Secondary | ICD-10-CM

## 2017-05-11 MED ORDER — ALPRAZOLAM 0.5 MG PO TABS
0.5000 mg | ORAL_TABLET | Freq: Two times a day (BID) | ORAL | 1 refills | Status: DC | PRN
Start: 2017-05-11 — End: 2017-08-15

## 2017-05-11 NOTE — Patient Instructions (Signed)
Great to see you!  Come back in 6 months unless you need us sooner.    

## 2017-05-11 NOTE — Progress Notes (Signed)
   HPI  Patient presents today here with inflamed skin tag as well as anxiety.  Anxiety Patient typically takes Xanax 2 or 3 times a week, however since her recent illness she has been taking almost once daily. She needs a refill Xanax.  Inflamed skin tag Right cheek, in the area that her glasses hit.  She has had frequent injury to the skin tag because of this.  It has recently bled.  PMH: Smoking status noted ROS: Per HPI  Objective: BP 133/81   Pulse 73   Temp (!) 97.3 F (36.3 C) (Oral)   Ht 5' 0.5" (1.537 m)   Wt 182 lb (82.6 kg)   BMI 34.96 kg/m  Gen: NAD, alert, cooperative with exam HEENT: NCAT CV: RRR, good S1/S2, no murmur Resp: CTABL, no wheezes, non-labored Ext: No edema, warm Neuro: Alert and oriented, No gross deficits Skin 3 mm raised hyperpigmented brown lesion on the right cheek around the zygomatic/maxillary transition at the base of the orbit. Heme crusting present around 3:0 to 7:00   Inflamed skin tag cryoablation Short burst of liquid nitrogen applied using 4mm guard. Tolrated well.    Assessment and plan:  #GAD Worsened lately with sent illness, refill of Xanax. Discussed controller medication if persistent. Birth: Controlled substance database reviewed with no red flags  #Inflamed skin tag Treated with cryoablation today, lightly given facial lesion.  Discussed usal course of illness.    Meds ordered this encounter  Medications  . ALPRAZolam (XANAX) 0.5 MG tablet    Sig: Take 1 tablet (0.5 mg total) by mouth 2 (two) times daily as needed.    Dispense:  90 tablet    Refill:  1    Not to exceed 5 additional fills before 05/21/2017.    Murtis SinkSam Bradshaw, MD [facility] 05/11/2017, 11:37 AM

## 2017-08-02 ENCOUNTER — Other Ambulatory Visit: Payer: BC Managed Care – PPO

## 2017-08-09 ENCOUNTER — Ambulatory Visit: Payer: Self-pay | Admitting: Family Medicine

## 2017-08-15 ENCOUNTER — Ambulatory Visit (INDEPENDENT_AMBULATORY_CARE_PROVIDER_SITE_OTHER): Payer: BC Managed Care – PPO

## 2017-08-15 ENCOUNTER — Ambulatory Visit: Payer: BC Managed Care – PPO | Admitting: Family Medicine

## 2017-08-15 ENCOUNTER — Encounter: Payer: Self-pay | Admitting: Family Medicine

## 2017-08-15 VITALS — BP 134/77 | HR 68 | Temp 97.4°F | Ht 60.5 in | Wt 186.4 lb

## 2017-08-15 DIAGNOSIS — F411 Generalized anxiety disorder: Secondary | ICD-10-CM

## 2017-08-15 DIAGNOSIS — E663 Overweight: Secondary | ICD-10-CM | POA: Diagnosis not present

## 2017-08-15 DIAGNOSIS — Z780 Asymptomatic menopausal state: Secondary | ICD-10-CM

## 2017-08-15 DIAGNOSIS — K279 Peptic ulcer, site unspecified, unspecified as acute or chronic, without hemorrhage or perforation: Secondary | ICD-10-CM

## 2017-08-15 MED ORDER — PANTOPRAZOLE SODIUM 40 MG PO TBEC: 40.0000 mg | DELAYED_RELEASE_TABLET | Freq: Every day | ORAL | 3 refills | Status: DC

## 2017-08-15 MED ORDER — ALPRAZOLAM 0.5 MG PO TABS: 0.5000 mg | ORAL_TABLET | Freq: Two times a day (BID) | ORAL | 1 refills | Status: DC | PRN

## 2017-08-15 NOTE — Patient Instructions (Signed)
Great to see you!  Take Protonix 1 pill once daily for 3 months, then take as needed.

## 2017-08-15 NOTE — Progress Notes (Signed)
   HPI  Patient presents today here for follow-up chronic medical conditions.  Patient does not room or having a DEXA scan, she will have one today.  Patient has peptic ulcer disease history, she was diagnosed last year but never took consistent PPIs, she states that she is only taking them as needed Still has some abdominal discomfort. Also states that she had peptic ulcer disease 40 years ago when she was first married.  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  Overweight-patient is fasting would like labs today  PMH: Smoking status noted ROS: Per HPI  Objective: BP 134/77   Pulse 68   Temp (!) 97.4 F (36.3 C) (Oral)   Ht 5' 0.5" (1.537 m)   Wt 186 lb 6.4 oz (84.6 kg)   BMI 35.80 kg/m  Gen: NAD, alert, cooperative with exam HEENT: NCAT CV: RRR, good S1/S2, no murmur Resp: CTABL, no wheezes, non-labored Ext: No edema, warm Neuro: Alert and oriented, No gross deficits  Assessment and plan:  #Anxiety Stable on Xanax Consider Remeron as well Refilled Xanax  #Overweight Patient concerned with weight gain Fasting labs today   #Peptic ulcer disease Patient with history of recurrent peptic ulcer disease, she states that she never took daily PPI, recommend daily PPI times 3 months then as needed.   #Postmenopausal-DEXA scan today    Orders Placed This Encounter  Procedures  . DG [facility] DEXA    Order Specific Question:   Reason for Exam (SYMPTOM  OR DIAGNOSIS REQUIRED)    Answer:   post menopausal    Meds ordered this encounter  Medications  . pantoprazole (PROTONIX) 40 MG tablet    Sig: Take 1 tablet (40 mg total) by mouth daily.    Dispense:  30 tablet    Refill:  3  . ALPRAZolam (XANAX) 0.5 MG tablet    Sig: Take 1 tablet (0.5 mg total) by mouth 2 (two) times daily as needed.    Dispense:  90 tablet    Refill:  1    Not to exceed 5 additional fills before 05/21/2017.    Murtis SinkSam Bradshaw, MD [facility] 08/15/2017, 10:23 AM

## 2017-08-16 LAB — LIPID PANEL
CHOL/HDL RATIO: 2.5 ratio (ref 0.0–4.4)
CHOLESTEROL TOTAL: 174 mg/dL (ref 100–199)
HDL: 69 mg/dL (ref >39)
LDL CALC: 88 mg/dL (ref 0–99)
TRIGLYCERIDES: 83 mg/dL (ref 0–149)
VLDL Cholesterol Cal: 17 mg/dL (ref 5–40)

## 2017-08-16 LAB — CMP14+EGFR
ALK PHOS: 85 IU/L (ref 39–117)
ALT: 27 IU/L (ref 0–32)
AST: 28 IU/L (ref 0–40)
Albumin/Globulin Ratio: 1.8 (ref 1.2–2.2)
Albumin: 4.4 g/dL (ref 3.6–4.8)
BUN/Creatinine Ratio: 18 (ref 12–28)
BUN: 19 mg/dL (ref 8–27)
Bilirubin Total: 0.8 mg/dL (ref 0.0–1.2)
CALCIUM: 9.8 mg/dL (ref 8.7–10.3)
CO2: 26 mmol/L (ref 20–29)
CREATININE: 1.04 mg/dL — AB (ref 0.57–1.00)
Chloride: 106 mmol/L (ref 96–106)
GFR calc Af Amer: 67 mL/min/{1.73_m2} (ref >59)
GFR, EST NON AFRICAN AMERICAN: 58 mL/min/{1.73_m2} — AB (ref >59)
Globulin, Total: 2.4 g/dL (ref 1.5–4.5)
Glucose: 110 mg/dL — ABNORMAL HIGH (ref 65–99)
Potassium: 5.5 mmol/L — ABNORMAL HIGH (ref 3.5–5.2)
Sodium: 148 mmol/L — ABNORMAL HIGH (ref 134–144)
Total Protein: 6.8 g/dL (ref 6.0–8.5)

## 2017-08-16 LAB — CBC WITH DIFFERENTIAL/PLATELET
Basophils Absolute: 0 10*3/uL (ref 0.0–0.2)
Basos: 0 %
EOS (ABSOLUTE): 0.1 10*3/uL (ref 0.0–0.4)
EOS: 1 %
Hematocrit: 42.2 % (ref 34.0–46.6)
Hemoglobin: 14.3 g/dL (ref 11.1–15.9)
IMMATURE GRANULOCYTES: 0 %
Immature Grans (Abs): 0 10*3/uL (ref 0.0–0.1)
Lymphocytes Absolute: 1.9 10*3/uL (ref 0.7–3.1)
Lymphs: 39 %
MCH: 29.9 pg (ref 26.6–33.0)
MCHC: 33.9 g/dL (ref 31.5–35.7)
MCV: 88 fL (ref 79–97)
MONOS ABS: 0.3 10*3/uL (ref 0.1–0.9)
Monocytes: 5 %
NEUTROS PCT: 55 %
Neutrophils Absolute: 2.6 10*3/uL (ref 1.4–7.0)
Platelets: 220 10*3/uL (ref 150–379)
RBC: 4.79 x10E6/uL (ref 3.77–5.28)
RDW: 12.9 % (ref 12.3–15.4)
WBC: 4.8 10*3/uL (ref 3.4–10.8)

## 2017-08-16 LAB — TSH: TSH: 1.08 u[IU]/mL (ref 0.450–4.500)

## 2017-08-21 ENCOUNTER — Telehealth: Payer: Self-pay | Admitting: Family Medicine

## 2017-08-21 NOTE — Telephone Encounter (Signed)
lmtcb

## 2017-08-21 NOTE — Telephone Encounter (Signed)
appt scheduled Pt notified 

## 2017-08-22 ENCOUNTER — Ambulatory Visit: Payer: BC Managed Care – PPO | Admitting: Physician Assistant

## 2017-08-22 ENCOUNTER — Encounter: Payer: Self-pay | Admitting: Physician Assistant

## 2017-08-22 VITALS — BP 152/82 | HR 73 | Temp 98.4°F | Ht 60.5 in | Wt 185.0 lb

## 2017-08-22 DIAGNOSIS — J0100 Acute maxillary sinusitis, unspecified: Secondary | ICD-10-CM | POA: Diagnosis not present

## 2017-08-22 MED ORDER — AMOXICILLIN 500 MG PO CAPS: 500.0000 mg | ORAL_CAPSULE | Freq: Three times a day (TID) | ORAL | 0 refills | Status: DC

## 2017-08-22 NOTE — Patient Instructions (Signed)
In a few days you may receive a survey in the mail or online from [employer] regarding your visit with us today. Please take a moment to fill this out. Your feedback is very important to our whole office. It can help us better understand your needs as well as improve your experience and satisfaction. Thank you for taking your time to complete it. We care about you.  Caroljean Monsivais, PA-C  

## 2017-08-22 NOTE — Progress Notes (Signed)
BP (!) 152/82   Pulse 73   Temp 98.4 F (36.9 C) (Oral)   Ht 5' 0.5" (1.537 m)   Wt 185 lb (83.9 kg)   BMI 35.54 kg/m    Subjective:    Patient ID: Rita Diaz, female    DOB: 1955-10-04, 62 y.o.   MRN: [id number]  HPI: Rita Diaz is a 62 y.o. female presenting on 08/22/2017 for Sinusitis and Head congestion This patient has had many days of sinus headache and postnasal drainage. There is copious drainage at times. Denies any fever at this time. There has been a history of sinus infections in the past.  No history of sinus surgery. There is cough at night. It has become more prevalent in recent days.  Past Medical History:  Diagnosis Date  . Anxiety   . GERD (gastroesophageal reflux disease)    occ  . H/O hiatal hernia   . History of kidney stones   . Swelling    L SIDE OF BODY  . Ventral hernia    Relevant past medical, surgical, family and social history reviewed and updated as indicated. Interim medical history since our last visit reviewed. Allergies and medications reviewed and updated. DATA REVIEWED: CHART IN EPIC  Family History reviewed for pertinent findings.  Review of Systems  Constitutional: Positive for chills and fatigue. Negative for activity change, appetite change and fever.  HENT: Positive for congestion, postnasal drip, sinus pressure, sinus pain and sore throat.   Eyes: Negative.   Respiratory: Positive for cough. Negative for shortness of breath and wheezing.   Cardiovascular: Negative.  Negative for chest pain, palpitations and leg swelling.  Gastrointestinal: Negative.   Genitourinary: Negative.   Musculoskeletal: Negative.   Skin: Negative.   Neurological: Positive for headaches.    Allergies as of 08/22/2017      Reactions   Aspirin Other (See Comments)   Makes her burn all over.      Medication List        Accurate as of 08/22/17 10:38 AM. Always use your most recent med list.          ALPRAZolam 0.5 MG tablet Commonly known as:   XANAX Take 1 tablet (0.5 mg total) by mouth 2 (two) times daily as needed.   amoxicillin 500 MG capsule Commonly known as:  AMOXIL Take 1 capsule (500 mg total) by mouth 3 (three) times daily.   pantoprazole 40 MG tablet Commonly known as:  PROTONIX Take 1 tablet (40 mg total) by mouth daily.          Objective:    BP (!) 152/82   Pulse 73   Temp 98.4 F (36.9 C) (Oral)   Ht 5' 0.5" (1.537 m)   Wt 185 lb (83.9 kg)   BMI 35.54 kg/m   Allergies  Allergen Reactions  . Aspirin Other (See Comments)    Makes her burn all over.    Wt Readings from Last 3 Encounters:  08/22/17 185 lb (83.9 kg)  08/15/17 186 lb 6.4 oz (84.6 kg)  05/11/17 182 lb (82.6 kg)    Physical Exam  Constitutional: She is oriented to person, place, and time. She appears well-developed and well-nourished.  HENT:  Head: Normocephalic and atraumatic.  Right Ear: Tympanic membrane and external ear normal. No middle ear effusion.  Left Ear: Tympanic membrane and external ear normal.  No middle ear effusion.  Nose: Mucosal edema and rhinorrhea present. Right sinus exhibits no maxillary sinus tenderness. Left sinus  exhibits no maxillary sinus tenderness.  Mouth/Throat: Uvula is midline. Posterior oropharyngeal erythema present.  Eyes: Conjunctivae and EOM are normal. Pupils are equal, round, and reactive to light. Right eye exhibits no discharge. Left eye exhibits no discharge.  Neck: Normal range of motion.  Cardiovascular: Normal rate, regular rhythm and normal heart sounds.  Pulmonary/Chest: Effort normal and breath sounds normal. No respiratory distress. She has no wheezes.  Abdominal: Soft.  Lymphadenopathy:    She has no cervical adenopathy.  Neurological: She is alert and oriented to person, place, and time.  Skin: Skin is warm and dry.  Psychiatric: She has a normal mood and affect.        Assessment & Plan:   1. Acute non-recurrent maxillary sinusitis - amoxicillin (AMOXIL) 500 MG capsule;  Take 1 capsule (500 mg total) by mouth 3 (three) times daily.  Dispense: 30 capsule; Refill: 0   Continue all other maintenance medications as listed above.  Follow up plan: Return if symptoms worsen or fail to improve.  Educational handout given for - Take meds as prescribed - Use a cool mist humidifier  -Use saline nose sprays frequently -Saline irrigations of the nose can be very helpful if done frequently.  * 4X daily for 1 week*  * Use of a nettie pot can be helpful with this. Follow directions with this* -Force fluids -For any cough or congestion  Use plain Mucinex- regular strength or max strength is fine   * Children- consult with Pharmacist for dosing -For fever or aces or pains- take tylenol or ibuprofen appropriate for age and weight.  * for fevers greater than 101 orally you may alternate ibuprofen and tylenol every  3 hours. -Throat lozenges if help -New toothbrush in 3 days     Remus Loffler PA-C [facility] [address]  [place], [place] [address] [phone]   08/22/2017, 10:38 AM

## 2017-08-24 ENCOUNTER — Other Ambulatory Visit: Payer: BC Managed Care – PPO

## 2017-08-24 DIAGNOSIS — E875 Hyperkalemia: Secondary | ICD-10-CM

## 2017-08-25 LAB — BMP8+EGFR
BUN/Creatinine Ratio: 15 (ref 12–28)
BUN: 14 mg/dL (ref 8–27)
CALCIUM: 9.5 mg/dL (ref 8.7–10.3)
CO2: 25 mmol/L (ref 20–29)
CREATININE: 0.92 mg/dL (ref 0.57–1.00)
Chloride: 105 mmol/L (ref 96–106)
GFR, EST AFRICAN AMERICAN: 78 mL/min/{1.73_m2} (ref >59)
GFR, EST NON AFRICAN AMERICAN: 67 mL/min/{1.73_m2} (ref >59)
Glucose: 120 mg/dL — ABNORMAL HIGH (ref 65–99)
Potassium: 4.8 mmol/L (ref 3.5–5.2)
Sodium: 144 mmol/L (ref 134–144)

## 2017-09-05 ENCOUNTER — Other Ambulatory Visit: Payer: Self-pay | Admitting: Family Medicine

## 2017-09-05 ENCOUNTER — Other Ambulatory Visit: Payer: Self-pay | Admitting: Physician Assistant

## 2017-09-05 MED ORDER — AMOXICILLIN-POT CLAVULANATE 875-125 MG PO TABS
1.0000 | ORAL_TABLET | Freq: Two times a day (BID) | ORAL | 0 refills | Status: DC
Start: 2017-09-05 — End: 2017-09-21

## 2017-09-05 NOTE — Telephone Encounter (Signed)
lmtcb

## 2017-09-05 NOTE — Telephone Encounter (Signed)
Pt aware rx sent into pharmacy. 

## 2017-09-05 NOTE — Telephone Encounter (Signed)
Sent augmentin

## 2017-09-12 ENCOUNTER — Telehealth: Payer: Self-pay | Admitting: Family Medicine

## 2017-09-13 NOTE — Telephone Encounter (Signed)
lmtcb

## 2017-09-13 NOTE — Telephone Encounter (Signed)
Patient aware that she needs to be seen in 3 months.

## 2017-09-21 ENCOUNTER — Ambulatory Visit: Payer: BC Managed Care – PPO | Admitting: Family Medicine

## 2017-09-21 ENCOUNTER — Encounter: Payer: Self-pay | Admitting: Family Medicine

## 2017-09-21 VITALS — BP 143/77 | HR 74 | Temp 98.2°F | Ht 60.5 in | Wt 181.4 lb

## 2017-09-21 DIAGNOSIS — A084 Viral intestinal infection, unspecified: Secondary | ICD-10-CM | POA: Diagnosis not present

## 2017-09-21 LAB — VERITOR FLU A/B WAIVED
INFLUENZA A: NEGATIVE
Influenza B: NEGATIVE

## 2017-09-21 MED ORDER — ONDANSETRON 4 MG PO TBDP: 4.0000 mg | ORAL_TABLET | Freq: Three times a day (TID) | ORAL | 0 refills | Status: DC | PRN

## 2017-09-21 NOTE — Patient Instructions (Signed)
Great to see you!  When I leave great choices for you would be : Dr. Mathis FareGottschalk Angel Jones Or Dr. Louanne Skyeettinger.

## 2017-09-21 NOTE — Progress Notes (Signed)
   HPI  Patient presents today here for illness.  Patient explains that about 8:30 PM Tuesday night she had a sudden onset of feeling ill.  She began having vomiting that happened throughout the night. She denies any blood or bile. States that she was also having diarrhea throughout the night.  This has improved somewhat and slowed down but she is still having episodes of vomiting.  The diarrhea has much improved.  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 No shortness of breath, cough, or difficulty tolerating fluids.  She states that despite all this she has been taking sips of fluids throughout the illness.  PMH: Smoking status noted ROS: Per HPI  Objective: BP (!) 143/77   Pulse 74   Temp 98.2 F (36.8 C) (Oral)   Ht 5' 0.5" (1.537 m)   Wt 181 lb 6.4 oz (82.3 kg)   BMI 34.84 kg/m  Gen: NAD, alert, cooperative with exam HEENT: NCAT, tacky mucous membranes CV: RRR, good S1/S2, no murmur Resp: CTABL, no wheezes, non-labored Ext: No edema, warm Neuro: Alert and oriented, No gross deficits  Assessment and plan:  #Viral gastroenteritis Patient is slightly dry mucous membranes, encourage fluids Zofran Rapid flu is negative Note for work given Return to clinic with any concerns     Orders Placed This Encounter  Procedures  . Veritor Flu A/B Waived    Order Specific Question:   Source    Answer:   nasal    Meds ordered this encounter  Medications  . ondansetron (ZOFRAN ODT) 4 MG disintegrating tablet    Sig: Take 1 tablet (4 mg total) by mouth every 8 (eight) hours as needed for nausea or vomiting.    Dispense:  20 tablet    Refill:  0    Murtis SinkSam Landen Breeland, MD [facility] 09/21/2017, 8:11 AM

## 2017-10-16 ENCOUNTER — Telehealth: Payer: Self-pay | Admitting: Family Medicine

## 2017-10-16 NOTE — Telephone Encounter (Signed)
Pt aware of recommendations - she may have a pelvic exam rather than a pap - she is aware that she can do this here or at GYN. She Is aware that insurance will pay for these exams every 2 years.

## 2017-10-16 NOTE — Telephone Encounter (Signed)
Pt had complete hysterectomy and she was told she did not have to have paps. Now someone is telling her different does she need a pap? Please advise.

## 2017-11-10 ENCOUNTER — Ambulatory Visit (INDEPENDENT_AMBULATORY_CARE_PROVIDER_SITE_OTHER): Payer: BC Managed Care – PPO | Admitting: Pediatrics

## 2017-11-10 ENCOUNTER — Encounter: Payer: Self-pay | Admitting: Pediatrics

## 2017-11-10 VITALS — BP 132/79 | HR 78 | Temp 98.0°F | Ht 60.5 in | Wt 184.0 lb

## 2017-11-10 DIAGNOSIS — Z0000 Encounter for general adult medical examination without abnormal findings: Secondary | ICD-10-CM

## 2017-11-10 DIAGNOSIS — Z1159 Encounter for screening for other viral diseases: Secondary | ICD-10-CM

## 2017-11-10 DIAGNOSIS — B372 Candidiasis of skin and nail: Secondary | ICD-10-CM

## 2017-11-10 DIAGNOSIS — R739 Hyperglycemia, unspecified: Secondary | ICD-10-CM

## 2017-11-10 LAB — BAYER DCA HB A1C WAIVED: HB A1C: 5.5 % (ref <7.0)

## 2017-11-10 MED ORDER — NYSTATIN 100000 UNIT/GM EX OINT: 1.0000 "application " | TOPICAL_OINTMENT | Freq: Two times a day (BID) | CUTANEOUS | 0 refills | Status: DC

## 2017-11-10 NOTE — Progress Notes (Signed)
  Subjective:   Patient ID: Rita Diaz, female    DOB: 1956/06/01, 62 y.o.   MRN: [id number] CC: Annual Exam (No pap)  HPI: Rita Diaz is a 62 y.o. female presenting for Annual Exam (No pap)  Walking about 1.5h 4 days a week. Has bowl of cereal for breakfast, half of sandwich, small portion sizes for dinner.  Drinks 1  Diet Mountain Dew in the morning, some Sprite zero later on.   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  She thinks this is been keeping her up at night.  She tried melatonin 5 mg for 3 nights.  Every morning she woke up with a bad headache.  She stopped and the headaches went away.  She does not snore.  She did not have any trouble sleeping before this job started.  She has 4 weeks left before the end of the school year.  Mood has been fine.  Watching her grandkids during the day she has been enjoying.  Mammogram: Up-to-date, last January 2019 Colonoscopy: Up-to-date, last December 2016, due in 10 years. Pap smear: Status post hysterectomy.  No dyspareunia or vaginal discharge.  Sometimes has irritation or smell from the skin at her hysterectomy fold, suprapubic area.  No redness, discharge, weeping.  No rash there now.  Relevant past medical, surgical, family and social history reviewed. Allergies and medications reviewed and updated. Social History   Tobacco Use  Smoking Status Never Smoker  Smokeless Tobacco Never Used   ROS: All systems negative other than what is in the HPI  Objective:    BP 132/79   Pulse 78   Temp 98 F (36.7 C) (Oral)   Ht 5' 0.5" (1.537 m)   Wt 184 lb (83.5 kg)   BMI 35.34 kg/m   Wt Readings from Last 3 Encounters:  11/10/17 184 lb (83.5 kg)  09/21/17 181 lb 6.4 oz (82.3 kg)  08/22/17 185 lb (83.9 kg)    Gen: NAD, alert, cooperative with  exam, NCAT EYES: EOMI, no conjunctival injection, or no icterus ENT:  TMs pearly gray b/l, OP without erythema LYMPH: no cervical LAD CV: NRRR, normal S1/S2, no murmur, distal pulses 2+ b/l Resp: CTABL, no wheezes, normal WOB Abd: +BS, soft, NTND. no guarding or organomegaly Ext: No edema, warm Neuro: Alert and oriented, strength equal b/l UE and LE, coordination grossly normal MSK: normal muscle bulk Skin: Slight erythema right side of small pannus around hysterectomy scar.   Assessment & Plan:  Rita Diaz was seen today for annual exam.  Diagnoses and all orders for this visit:  Encounter for preventive health examination  Hyperglycemia Patient says she is fasting on recent labs, blood sugar 110, 120.  Will check average blood sugar. -     Bayer DCA Hb A1c Waived  Encounter for hepatitis C screening test for low risk patient -     Hepatitis C antibody  Skin candidiasis Keep skin dry.  Use below if rash returns.  If not improving let us know. -     nystatin ointment (MYCOSTATIN); Apply 1 application topically 2 (two) times daily.   Follow up plan: Return in about 1 year (around 11/11/2018). Rex Krasarol Spencer Peterkin, MD [facility]

## 2017-11-11 LAB — HEPATITIS C ANTIBODY

## 2017-12-03 ENCOUNTER — Other Ambulatory Visit: Payer: Self-pay | Admitting: Family Medicine

## 2017-12-04 ENCOUNTER — Ambulatory Visit: Payer: BC Managed Care – PPO | Admitting: Family Medicine

## 2017-12-04 ENCOUNTER — Encounter: Payer: Self-pay | Admitting: Family Medicine

## 2017-12-04 VITALS — BP 129/79 | HR 74 | Temp 98.4°F | Ht 60.5 in | Wt 184.2 lb

## 2017-12-04 DIAGNOSIS — R0789 Other chest pain: Secondary | ICD-10-CM

## 2017-12-04 DIAGNOSIS — K2100 Gastro-esophageal reflux disease with esophagitis, without bleeding: Secondary | ICD-10-CM

## 2017-12-04 DIAGNOSIS — K279 Peptic ulcer, site unspecified, unspecified as acute or chronic, without hemorrhage or perforation: Secondary | ICD-10-CM | POA: Diagnosis not present

## 2017-12-04 MED ORDER — PANTOPRAZOLE SODIUM 40 MG PO TBEC: 40.0000 mg | DELAYED_RELEASE_TABLET | Freq: Every day | ORAL | 3 refills | Status: DC

## 2017-12-04 NOTE — Patient Instructions (Signed)
Great to see you!   

## 2017-12-04 NOTE — Progress Notes (Signed)
   HPI  Patient presents today here for follow-up GERD.  Patient was seen last for GERD and started on Protonix with good effect.  She described a history of multiple ulcers. She is been off medication for 1 week and has had rebound symptoms that she would like to control.  She does describe one episode of left-sided chest pain in the left upper chest about 2 weeks ago.  It lasted about 15 minutes and was sharp in nature with no radiation.  She states it felt better with massaging her left chest. Patient states that she works vigorously in the yard frequently and does not have any chest pain with that.   PMH: Smoking status noted ROS: Per HPI  Objective: BP 129/79   Pulse 74   Temp 98.4 F (36.9 C) (Oral)   Ht 5' 0.5" (1.537 m)   Wt 184 lb 3.2 oz (83.6 kg)   BMI 35.38 kg/m  Gen: NAD, alert, cooperative with exam HEENT: NCAT CV: RRR, good S1/S2, no murmur Resp: CTABL, no wheezes, non-labored Ext: No edema, warm Neuro: Alert and oriented, No gross deficits  Assessment and plan:  #Atypical chest pain Likely muscle wall pain, EKG today is non acute with no signs of ischemia- NSR Offered cardiology for full eval and she declines.  Mother with CAD and CABG between 59-65 New problem  #GERD, history of peptic ulcer disease Continue daily PPI, has had very good results    Orders Placed This Encounter  Procedures  . EKG 12-Lead    Meds ordered this encounter  Medications  . pantoprazole (PROTONIX) 40 MG tablet    Sig: Take 1 tablet (40 mg total) by mouth daily.    Dispense:  90 tablet    Refill:  3    Murtis Sink, MD [facility] 12/04/2017, 9:44 AM

## 2017-12-29 ENCOUNTER — Telehealth: Payer: Self-pay | Admitting: Physician Assistant

## 2017-12-29 NOTE — Telephone Encounter (Signed)
Pt aware that this was not a RX from us  - per [facility] it came from [place]lyce [place]iceKaplan [place]

## 2018-01-06 ENCOUNTER — Other Ambulatory Visit: Payer: Self-pay | Admitting: Physician Assistant

## 2018-01-06 ENCOUNTER — Telehealth: Payer: Self-pay | Admitting: Physician Assistant

## 2018-01-06 MED ORDER — ALPRAZOLAM 0.5 MG PO TABS: 0.5000 mg | ORAL_TABLET | Freq: Two times a day (BID) | ORAL | 1 refills | Status: DC | PRN

## 2018-01-06 NOTE — Telephone Encounter (Signed)
sent 

## 2018-01-06 NOTE — Telephone Encounter (Signed)
Patient notified that rx sent to pharmacy 

## 2018-01-06 NOTE — Telephone Encounter (Signed)
Please review

## 2018-02-01 ENCOUNTER — Telehealth: Payer: Self-pay | Admitting: Physician Assistant

## 2018-02-01 NOTE — Telephone Encounter (Signed)
Pt called to see if she needed to continue taking the protonix. I asked pt if she wanted to stop taking the medication or if she was having any problems with the medication. Pt denied problems with the medication and also states she wanted to continue taking the medication. Advised pt to continue taking as prescribed and to discuss at next f/u appt if she wants to stop taking. Pt voiced understanding.

## 2018-02-20 ENCOUNTER — Ambulatory Visit: Payer: BC Managed Care – PPO | Admitting: Physician Assistant

## 2018-02-20 ENCOUNTER — Encounter: Payer: Self-pay | Admitting: Physician Assistant

## 2018-02-20 VITALS — BP 146/82 | HR 70 | Temp 97.3°F | Ht 60.05 in | Wt 180.8 lb

## 2018-02-20 DIAGNOSIS — K2100 Gastro-esophageal reflux disease with esophagitis, without bleeding: Secondary | ICD-10-CM

## 2018-02-20 DIAGNOSIS — Z8719 Personal history of other diseases of the digestive system: Secondary | ICD-10-CM

## 2018-02-20 DIAGNOSIS — K279 Peptic ulcer, site unspecified, unspecified as acute or chronic, without hemorrhage or perforation: Secondary | ICD-10-CM

## 2018-02-20 DIAGNOSIS — K439 Ventral hernia without obstruction or gangrene: Secondary | ICD-10-CM | POA: Diagnosis not present

## 2018-02-20 DIAGNOSIS — Z9889 Other specified postprocedural states: Secondary | ICD-10-CM | POA: Diagnosis not present

## 2018-02-20 MED ORDER — OMEPRAZOLE 40 MG PO CPDR: 40.0000 mg | DELAYED_RELEASE_CAPSULE | Freq: Every day | ORAL | 1 refills | Status: DC

## 2018-02-21 NOTE — Progress Notes (Signed)
BP (!) 146/82   Pulse 70   Temp (!) 97.3 F (36.3 C) (Oral)   Ht 5' 0.05" (1.525 m)   Wt 180 lb 12.8 oz (82 kg)   BMI 35.25 kg/m    Subjective:    Patient ID: Rita Diaz Jarnagin, female    DOB: 1956-04-02, 62 y.o.   MRN: [id number]  HPI: Rita Diaz Dykema is a 62 y.o. female presenting on 02/20/2018 for Abdominal Pain (x 2 weeks )  This patient has had multiple ventral wall hernia repairs. Last one by Dr. Margaree Mackintoshsui in [place]. She states the pain is from the center of the upper abdomen and crosses to the left upper quadrant. The pain is all superficial. She has not had changes in her bowel and bladder functions.  It is tender to the touch. When she sits up she can see a bulge across the area described above. This is a new finding.  She does have GERD but has been well controlled on her medication. She denies esophageal or nocturnal symptoms. She has remained on her PPI.  The prilosec worked better that the protonix and would like to go back to it.  Past Medical History:  Diagnosis Date  . Anxiety   . GERD (gastroesophageal reflux disease)    occ  . H/O hiatal hernia   . History of kidney stones   . Swelling    L SIDE OF BODY  . Ventral hernia    Relevant past medical, surgical, family and social history reviewed and updated as indicated. Interim medical history since our last visit reviewed. Allergies and medications reviewed and updated. DATA REVIEWED: CHART IN EPIC  Family History reviewed for pertinent findings.  Review of Systems  Constitutional: Negative.  Negative for activity change, fatigue and fever.  HENT: Negative.   Eyes: Negative.   Respiratory: Negative.  Negative for cough.   Cardiovascular: Negative.  Negative for chest pain.  Gastrointestinal: Positive for abdominal distention and abdominal pain. Negative for constipation, diarrhea and nausea.  Endocrine: Negative.   Genitourinary: Negative.  Negative for dysuria.  Musculoskeletal: Negative.   Skin: Negative.    Neurological: Negative.     Allergies as of 02/20/2018      Reactions   Aspirin Other (See Comments)   Makes her burn all over.      Medication List        Accurate as of 02/20/18 11:59 PM. Always use your most recent med list.          ALPRAZolam 0.5 MG tablet Commonly known as:  XANAX Take 1 tablet (0.5 mg total) by mouth 2 (two) times daily as needed.   omeprazole 40 MG capsule Commonly known as:  PRILOSEC Take 1 capsule (40 mg total) by mouth daily.          Objective:    BP (!) 146/82   Pulse 70   Temp (!) 97.3 F (36.3 C) (Oral)   Ht 5' 0.05" (1.525 m)   Wt 180 lb 12.8 oz (82 kg)   BMI 35.25 kg/m   Allergies  Allergen Reactions  . Aspirin Other (See Comments)    Makes her burn all over.    Wt Readings from Last 3 Encounters:  02/20/18 180 lb 12.8 oz (82 kg)  12/04/17 184 lb 3.2 oz (83.6 kg)  11/10/17 184 lb (83.5 kg)    Physical Exam  Constitutional: She is oriented to person, place, and time. She appears well-developed and well-nourished.  HENT:  Head: Normocephalic and atraumatic.  Eyes: Pupils are equal, round, and reactive to light. Conjunctivae and EOM are normal.  Cardiovascular: Normal rate, regular rhythm, normal heart sounds and intact distal pulses.  Pulmonary/Chest: Effort normal and breath sounds normal.  Abdominal: Soft. Bowel sounds are normal. She exhibits no distension. There is no hepatosplenomegaly. There is tenderness in the epigastric area and left upper quadrant. There is no rebound, no guarding and no CVA tenderness. A hernia is present. Hernia confirmed positive in the ventral area.    Upon sitting up from exam ridge is pronounced across the upper abdomen  Neurological: She is alert and oriented to person, place, and time. She has normal reflexes.  Skin: Skin is warm and dry. No rash noted.  Psychiatric: She has a normal mood and affect. Her behavior is normal. Judgment and thought content normal.    Results for orders  placed or performed in visit on 11/10/17  Bayer DCA Hb A1c Waived  Result Value Ref Range   HB A1C (BAYER DCA - WAIVED) 5.5 <7.0 %  Hepatitis C antibody  Result Value Ref Range   Hep C Virus Ab <0.1 0.0 - 0.9 s/co ratio      Assessment & Plan:   1. Hernia of abdominal wall - Ambulatory referral to General Surgery  2. S/P repair of recurrent ventral hernia - Ambulatory referral to General Surgery  3. Gastroesophageal reflux disease with esophagitis - omeprazole (PRILOSEC) 40 MG capsule; Take 1 capsule (40 mg total) by mouth daily.  Dispense: 30 capsule; Refill: 1  4. PUD (peptic ulcer disease) - omeprazole (PRILOSEC) 40 MG capsule; Take 1 capsule (40 mg total) by mouth daily.  Dispense: 30 capsule; Refill: 1   Continue all other maintenance medications as listed above.  Follow up plan: No follow-ups on file.  Educational handout given for survey  Remus Loffler PA-C [facility] [address]  [place], [place] [address] [phone]   02/21/2018, 9:00 AM

## 2018-03-13 ENCOUNTER — Ambulatory Visit: Payer: BC Managed Care – PPO | Admitting: Physician Assistant

## 2018-03-26 ENCOUNTER — Ambulatory Visit
Admission: RE | Admit: 2018-03-26 | Discharge: 2018-03-26 | Disposition: A | Discharge status: Home / Self Care | Payer: BC Managed Care – PPO | Source: Ambulatory Visit | Attending: Surgery | Admitting: Surgery

## 2018-03-26 ENCOUNTER — Other Ambulatory Visit: Payer: Self-pay | Admitting: Surgery

## 2018-03-26 DIAGNOSIS — R0789 Other chest pain: Secondary | ICD-10-CM

## 2018-03-30 ENCOUNTER — Telehealth: Payer: Self-pay | Admitting: Physician Assistant

## 2018-03-30 NOTE — Telephone Encounter (Signed)
Pt aware to drink plenty of water and use otc pain meds

## 2018-05-04 ENCOUNTER — Telehealth: Payer: Self-pay | Admitting: Physician Assistant

## 2018-05-04 MED ORDER — OMEPRAZOLE 20 MG PO CPDR: 20.0000 mg | DELAYED_RELEASE_CAPSULE | Freq: Every day | ORAL | 3 refills | Status: DC

## 2018-05-04 NOTE — Telephone Encounter (Signed)
Okay to resuce

## 2018-05-04 NOTE — Telephone Encounter (Signed)
Nauseas and weak feeling since she started taking the 40 MG of the omeprazole (PRILOSEC)   wants to know if she needs to go back to the 20 mg

## 2018-05-04 NOTE — Telephone Encounter (Signed)
Patient aware.

## 2018-06-06 ENCOUNTER — Ambulatory Visit: Payer: BC Managed Care – PPO | Admitting: Pediatrics

## 2018-06-06 ENCOUNTER — Encounter: Payer: Self-pay | Admitting: Physician Assistant

## 2018-06-06 ENCOUNTER — Ambulatory Visit: Payer: BC Managed Care – PPO | Admitting: Physician Assistant

## 2018-06-06 VITALS — BP 166/82 | HR 70 | Temp 98.0°F | Ht 60.05 in | Wt 180.6 lb

## 2018-06-06 DIAGNOSIS — Z8719 Personal history of other diseases of the digestive system: Secondary | ICD-10-CM

## 2018-06-06 DIAGNOSIS — Z9889 Other specified postprocedural states: Secondary | ICD-10-CM

## 2018-06-06 DIAGNOSIS — R030 Elevated blood-pressure reading, without diagnosis of hypertension: Secondary | ICD-10-CM | POA: Diagnosis not present

## 2018-06-06 DIAGNOSIS — J0110 Acute frontal sinusitis, unspecified: Secondary | ICD-10-CM | POA: Diagnosis not present

## 2018-06-06 DIAGNOSIS — K439 Ventral hernia without obstruction or gangrene: Secondary | ICD-10-CM | POA: Diagnosis not present

## 2018-06-06 MED ORDER — AMOXICILLIN 500 MG PO CAPS: 1000.0000 mg | ORAL_CAPSULE | Freq: Two times a day (BID) | ORAL | 0 refills | Status: DC

## 2018-06-09 DIAGNOSIS — R030 Elevated blood-pressure reading, without diagnosis of hypertension: Secondary | ICD-10-CM | POA: Insufficient documentation

## 2018-06-09 NOTE — Progress Notes (Signed)
BP (!) 166/82   Pulse 70   Temp 98 F (36.7 C) (Oral)   Ht 5' 0.05" (1.525 m)   Wt 180 lb 9.6 oz (81.9 kg)   BMI 35.21 kg/m    Subjective:    Patient ID: Rita Diaz, female    DOB: 01/31/1956, 62 y.o.   MRN: [id number]  HPI: Rita Diaz is a 62 y.o. female presenting on 06/06/2018 for Gastroesophageal Reflux (6 month follow up ) This patient comes in for periodic recheck on medications and conditions including elevated blood pressure, history of hernia.  This patient has had many days of sinus headache and postnasal drainage. There is copious drainage at times. Denies any fever at this time. There has been a history of sinus infections in the past.  No history of sinus surgery. There is cough at night. It has become more prevalent in recent days. .   All medications are reviewed today. There are no reports of any problems with the medications. All of the medical conditions are reviewed and updated.  Lab work is reviewed and will be ordered as medically necessary. There are no new problems reported with today's visit.    Past Medical History:  Diagnosis Date  . Anxiety   . GERD (gastroesophageal reflux disease)    occ  . H/O hiatal hernia   . History of kidney stones   . Swelling    L SIDE OF BODY  . Ventral hernia    Relevant past medical, surgical, family and social history reviewed and updated as indicated. Interim medical history since our last visit reviewed. Allergies and medications reviewed and updated. DATA REVIEWED: CHART IN EPIC  Family History reviewed for pertinent findings.  Review of Systems  Constitutional: Positive for appetite change, chills, fatigue and fever. Negative for activity change.  HENT: Positive for congestion, postnasal drip, sinus pressure, sinus pain and sore throat.   Eyes: Negative.   Respiratory: Negative for cough and wheezing.   Cardiovascular: Negative.  Negative for chest pain, palpitations and leg swelling.    Gastrointestinal: Negative.   Genitourinary: Negative.   Musculoskeletal: Negative for myalgias.  Skin: Negative.   Neurological: Positive for headaches.    Allergies as of 06/06/2018      Reactions   Aspirin Other (See Comments)   Makes her burn all over.      Medication List        Accurate as of 06/06/18 11:59 PM. Always use your most recent med list.          ALPRAZolam 0.5 MG tablet Commonly known as:  XANAX Take 1 tablet (0.5 mg total) by mouth 2 (two) times daily as needed.   amoxicillin 500 MG capsule Commonly known as:  AMOXIL Take 2 capsules (1,000 mg total) by mouth 2 (two) times daily.   ANTIOXIDANT FORMULA PO Take by mouth.   ESTER C PO Take by mouth.   omeprazole 20 MG capsule Commonly known as:  PRILOSEC Take 1 capsule (20 mg total) by mouth daily.          Objective:    BP (!) 166/82   Pulse 70   Temp 98 F (36.7 C) (Oral)   Ht 5' 0.05" (1.525 m)   Wt 180 lb 9.6 oz (81.9 kg)   BMI 35.21 kg/m   Allergies  Allergen Reactions  . Aspirin Other (See Comments)    Makes her burn all over.    Wt Readings from Last 3 Encounters:  06/06/18  180 lb 9.6 oz (81.9 kg)  02/20/18 180 lb 12.8 oz (82 kg)  12/04/17 184 lb 3.2 oz (83.6 kg)    Physical Exam  Constitutional: She is oriented to person, place, and time. She appears well-developed and well-nourished.  HENT:  Head: Normocephalic and atraumatic.  Right Ear: Tympanic membrane and external ear normal. No middle ear effusion.  Left Ear: Tympanic membrane and external ear normal.  No middle ear effusion.  Nose: Mucosal edema and rhinorrhea present. Right sinus exhibits no maxillary sinus tenderness. Left sinus exhibits no maxillary sinus tenderness.  Mouth/Throat: Uvula is midline. Posterior oropharyngeal erythema present.  Eyes: Pupils are equal, round, and reactive to light. Conjunctivae and EOM are normal. Right eye exhibits no discharge. Left eye exhibits no discharge.  Neck: Normal  range of motion.  Cardiovascular: Normal rate, regular rhythm and normal heart sounds.  Pulmonary/Chest: Effort normal and breath sounds normal. No respiratory distress. She has no wheezes.  Abdominal: Soft.  Lymphadenopathy:    She has no cervical adenopathy.  Neurological: She is alert and oriented to person, place, and time.  Skin: Skin is warm and dry.  Psychiatric: She has a normal mood and affect.    Results for orders placed or performed in visit on 11/10/17  Bayer DCA Hb A1c Waived  Result Value Ref Range   HB A1C (BAYER DCA - WAIVED) 5.5 <7.0 %  Hepatitis C antibody  Result Value Ref Range   Hep C Virus Ab <0.1 0.0 - 0.9 s/co ratio      Assessment & Plan:   1. Acute non-recurrent frontal sinusitis - amoxicillin (AMOXIL) 500 MG capsule; Take 2 capsules (1,000 mg total) by mouth 2 (two) times daily.  Dispense: 30 capsule; Refill: 0  2. Elevated blood pressure reading in office without diagnosis of hypertension  3. S/P repair of recurrent ventral hernia Continue with surgeon  4. Hernia of abdominal wall Continue with surgeon   Continue all other maintenance medications as listed above.  Follow up plan: Return in about 3 months (around 09/06/2018) for check and labs.  Educational handout given for survey  Remus LofflerAngel S. Athena Baltz PA-C [facility] [address]  [place], [place] [id number] [phone]   06/09/2018, 4:20 PM

## 2018-06-17 ENCOUNTER — Other Ambulatory Visit: Payer: Self-pay | Admitting: Physician Assistant

## 2018-06-18 NOTE — Telephone Encounter (Signed)
Last seen 05/2018

## 2018-06-19 ENCOUNTER — Telehealth: Payer: Self-pay | Admitting: Physician Assistant

## 2018-06-20 ENCOUNTER — Other Ambulatory Visit: Payer: Self-pay | Admitting: Physician Assistant

## 2018-06-20 MED ORDER — AMOXICILLIN-POT CLAVULANATE 875-125 MG PO TABS: 1.0000 | ORAL_TABLET | Freq: Two times a day (BID) | ORAL | 0 refills | Status: AC

## 2018-06-20 NOTE — Telephone Encounter (Signed)
Pt aware.

## 2018-06-20 NOTE — Telephone Encounter (Signed)
Augmentin for 14 days was sent

## 2018-07-31 ENCOUNTER — Encounter: Payer: Self-pay | Admitting: Physician Assistant

## 2018-07-31 ENCOUNTER — Ambulatory Visit: Payer: BC Managed Care – PPO | Admitting: Physician Assistant

## 2018-07-31 VITALS — BP 131/72 | HR 74 | Temp 98.0°F | Ht 60.05 in | Wt 183.8 lb

## 2018-07-31 DIAGNOSIS — J0110 Acute frontal sinusitis, unspecified: Secondary | ICD-10-CM | POA: Diagnosis not present

## 2018-07-31 DIAGNOSIS — Z0000 Encounter for general adult medical examination without abnormal findings: Secondary | ICD-10-CM

## 2018-07-31 MED ORDER — AMOXICILLIN-POT CLAVULANATE 875-125 MG PO TABS: 1.0000 | ORAL_TABLET | Freq: Two times a day (BID) | ORAL | 1 refills | Status: DC

## 2018-08-01 LAB — CBC WITH DIFFERENTIAL/PLATELET
BASOS ABS: 0 10*3/uL (ref 0.0–0.2)
Basos: 1 %
EOS (ABSOLUTE): 0.1 10*3/uL (ref 0.0–0.4)
Eos: 1 %
Hematocrit: 42.1 % (ref 34.0–46.6)
Hemoglobin: 14.3 g/dL (ref 11.1–15.9)
Immature Grans (Abs): 0 10*3/uL (ref 0.0–0.1)
Immature Granulocytes: 0 %
Lymphocytes Absolute: 2 10*3/uL (ref 0.7–3.1)
Lymphs: 36 %
MCH: 29.7 pg (ref 26.6–33.0)
MCHC: 34 g/dL (ref 31.5–35.7)
MCV: 88 fL (ref 79–97)
MONOCYTES: 5 %
Monocytes Absolute: 0.3 10*3/uL (ref 0.1–0.9)
Neutrophils Absolute: 3.2 10*3/uL (ref 1.4–7.0)
Neutrophils: 57 %
Platelets: 236 10*3/uL (ref 150–450)
RBC: 4.81 x10E6/uL (ref 3.77–5.28)
RDW: 12.2 % (ref 11.7–15.4)
WBC: 5.5 10*3/uL (ref 3.4–10.8)

## 2018-08-01 LAB — CMP14+EGFR
A/G RATIO: 1.9 (ref 1.2–2.2)
ALBUMIN: 4.2 g/dL (ref 3.6–4.8)
ALK PHOS: 94 IU/L (ref 39–117)
ALT: 23 IU/L (ref 0–32)
AST: 25 IU/L (ref 0–40)
BUN / CREAT RATIO: 18 (ref 12–28)
BUN: 17 mg/dL (ref 8–27)
Bilirubin Total: 0.3 mg/dL (ref 0.0–1.2)
CO2: 25 mmol/L (ref 20–29)
CREATININE: 0.92 mg/dL (ref 0.57–1.00)
Calcium: 9.3 mg/dL (ref 8.7–10.3)
Chloride: 106 mmol/L (ref 96–106)
GFR calc Af Amer: 77 mL/min/{1.73_m2} (ref >59)
GFR, EST NON AFRICAN AMERICAN: 67 mL/min/{1.73_m2} (ref >59)
GLOBULIN, TOTAL: 2.2 g/dL (ref 1.5–4.5)
Glucose: 110 mg/dL — ABNORMAL HIGH (ref 65–99)
POTASSIUM: 5 mmol/L (ref 3.5–5.2)
SODIUM: 146 mmol/L — AB (ref 134–144)
Total Protein: 6.4 g/dL (ref 6.0–8.5)

## 2018-08-01 LAB — LIPID PANEL
Chol/HDL Ratio: 2.5 ratio (ref 0.0–4.4)
Cholesterol, Total: 181 mg/dL (ref 100–199)
HDL: 73 mg/dL (ref >39)
LDL Calculated: 93 mg/dL (ref 0–99)
Triglycerides: 73 mg/dL (ref 0–149)
VLDL Cholesterol Cal: 15 mg/dL (ref 5–40)

## 2018-08-01 LAB — TSH: TSH: 1.85 u[IU]/mL (ref 0.450–4.500)

## 2018-08-02 NOTE — Progress Notes (Signed)
BP 131/72   Pulse 74   Temp 98 F (36.7 C) (Oral)   Ht 5' 0.05" (1.525 m)   Wt 183 lb 12.8 oz (83.4 kg)   BMI 35.84 kg/m    Subjective:    Patient ID: Rita Diaz, female    DOB: Nov 20, 1955, 63 y.o.   MRN: [id number]  HPI: Rita Diaz is a 63 y.o. female presenting on 07/31/2018 for Headache  This patient has had many days of sinus headache and postnasal drainage. There is copious drainage at times. Denies any fever at this time. There has been a history of sinus infections in the past.  No history of sinus surgery. There is cough at night. It has become more prevalent in recent days.  She is also needing her well labs and check. No oter issues at this time.  Past Medical History:  Diagnosis Date  . Anxiety   . GERD (gastroesophageal reflux disease)    occ  . H/O hiatal hernia   . History of kidney stones   . Swelling    L SIDE OF BODY  . Ventral hernia    Relevant past medical, surgical, family and social history reviewed and updated as indicated. Interim medical history since our last visit reviewed. Allergies and medications reviewed and updated. DATA REVIEWED: CHART IN EPIC  Family History reviewed for pertinent findings.  Review of Systems  Allergies as of 07/31/2018      Reactions   Aspirin Other (See Comments)   Makes her burn all over.      Medication List       Accurate as of July 31, 2018 11:59 PM. Always use your most recent med list.        ALPRAZolam 0.5 MG tablet Commonly known as:  XANAX TAKE 1 TABLET (0.5 MG TOTAL) BY MOUTH 2 (TWO) TIMES DAILY AS NEEDED.   amoxicillin-clavulanate 875-125 MG tablet Commonly known as:  AUGMENTIN Take 1 tablet by mouth 2 (two) times daily.   ANTIOXIDANT FORMULA PO Take by mouth.   ESTER C PO Take by mouth.   omeprazole 20 MG capsule Commonly known as:  PRILOSEC Take 1 capsule (20 mg total) by mouth daily.          Objective:    BP 131/72   Pulse 74   Temp 98 F (36.7 C) (Oral)   Ht 5'  0.05" (1.525 m)   Wt 183 lb 12.8 oz (83.4 kg)   BMI 35.84 kg/m   Allergies  Allergen Reactions  . Aspirin Other (See Comments)    Makes her burn all over.    Wt Readings from Last 3 Encounters:  07/31/18 183 lb 12.8 oz (83.4 kg)  06/06/18 180 lb 9.6 oz (81.9 kg)  02/20/18 180 lb 12.8 oz (82 kg)    Physical Exam  Results for orders placed or performed in visit on 07/31/18  CMP14+EGFR  Result Value Ref Range   Glucose 110 (H) 65 - 99 mg/dL   BUN 17 8 - 27 mg/dL   Creatinine, Ser 0.92 0.57 - 1.00 mg/dL   GFR calc non Af Amer 67 >59 mL/min/1.73   GFR calc Af Amer 77 >59 mL/min/1.73   BUN/Creatinine Ratio 18 12 - 28   Sodium 146 (H) 134 - 144 mmol/L   Potassium 5.0 3.5 - 5.2 mmol/L   Chloride 106 96 - 106 mmol/L   CO2 25 20 - 29 mmol/L   Calcium 9.3 8.7 - 10.3 mg/dL   Total  Protein 6.4 6.0 - 8.5 g/dL   Albumin 4.2 3.6 - 4.8 g/dL   Globulin, Total 2.2 1.5 - 4.5 g/dL   Albumin/Globulin Ratio 1.9 1.2 - 2.2   Bilirubin Total 0.3 0.0 - 1.2 mg/dL   Alkaline Phosphatase 94 39 - 117 IU/L   AST 25 0 - 40 IU/L   ALT 23 0 - 32 IU/L  CBC with Differential/Platelet  Result Value Ref Range   WBC 5.5 3.4 - 10.8 x10E3/uL   RBC 4.81 3.77 - 5.28 x10E6/uL   Hemoglobin 14.3 11.1 - 15.9 g/dL   Hematocrit 42.1 34.0 - 46.6 %   MCV 88 79 - 97 fL   MCH 29.7 26.6 - 33.0 pg   MCHC 34.0 31.5 - 35.7 g/dL   RDW 12.2 11.7 - 15.4 %   Platelets 236 150 - 450 x10E3/uL   Neutrophils 57 Not Estab. %   Lymphs 36 Not Estab. %   Monocytes 5 Not Estab. %   Eos 1 Not Estab. %   Basos 1 Not Estab. %   Neutrophils Absolute 3.2 1.4 - 7.0 x10E3/uL   Lymphocytes Absolute 2.0 0.7 - 3.1 x10E3/uL   Monocytes Absolute 0.3 0.1 - 0.9 x10E3/uL   EOS (ABSOLUTE) 0.1 0.0 - 0.4 x10E3/uL   Basophils Absolute 0.0 0.0 - 0.2 x10E3/uL   Immature Granulocytes 0 Not Estab. %   Immature Grans (Abs) 0.0 0.0 - 0.1 x10E3/uL  Lipid panel  Result Value Ref Range   Cholesterol, Total 181 100 - 199 mg/dL   Triglycerides 73 0 -  149 mg/dL   HDL 73 >39 mg/dL   VLDL Cholesterol Cal 15 5 - 40 mg/dL   LDL Calculated 93 0 - 99 mg/dL   Chol/HDL Ratio 2.5 0.0 - 4.4 ratio  TSH  Result Value Ref Range   TSH 1.850 0.450 - 4.500 uIU/mL      Assessment & Plan:   1. Well adult exam - CMP14+EGFR - CBC with Differential/Platelet - Lipid panel - TSH  2. Acute non-recurrent frontal sinusitis - amoxicillin-clavulanate (AUGMENTIN) 875-125 MG tablet; Take 1 tablet by mouth 2 (two) times daily.  Dispense: 28 tablet; Refill: 1   Continue all other maintenance medications as listed above.  Follow up plan: Return in about 6 months (around 01/29/2019) for recheck.  Educational handout given for Shindler PA-C [place] [address]  [place], [place] [address] [phone]   08/02/2018, 10:27 PM

## 2018-08-10 ENCOUNTER — Other Ambulatory Visit: Payer: Self-pay | Admitting: Surgery

## 2018-08-10 DIAGNOSIS — R079 Chest pain, unspecified: Secondary | ICD-10-CM

## 2018-08-14 ENCOUNTER — Other Ambulatory Visit: Payer: Self-pay | Admitting: Surgery

## 2018-08-15 LAB — HM MAMMOGRAPHY

## 2018-08-17 ENCOUNTER — Ambulatory Visit
Admission: RE | Admit: 2018-08-17 | Discharge: 2018-08-17 | Disposition: A | Discharge status: Home / Self Care | Payer: BC Managed Care – PPO | Source: Ambulatory Visit | Attending: Surgery | Admitting: Surgery

## 2018-08-17 DIAGNOSIS — R079 Chest pain, unspecified: Secondary | ICD-10-CM

## 2018-08-17 MED ORDER — IOPAMIDOL (ISOVUE-300) INJECTION 61%
75.0000 mL | Freq: Once | INTRAVENOUS | Status: AC | PRN
  Administered 2018-08-17: 75 mL via INTRAVENOUS

## 2018-08-18 NOTE — Progress Notes (Signed)
Please call the patient and let them know that their CT scan showed no reason for the pain

## 2018-09-07 ENCOUNTER — Other Ambulatory Visit: Payer: Self-pay | Admitting: Physician Assistant

## 2018-09-07 DIAGNOSIS — K2100 Gastro-esophageal reflux disease with esophagitis, without bleeding: Secondary | ICD-10-CM

## 2018-09-07 DIAGNOSIS — K279 Peptic ulcer, site unspecified, unspecified as acute or chronic, without hemorrhage or perforation: Secondary | ICD-10-CM

## 2018-09-10 ENCOUNTER — Ambulatory Visit: Payer: BC Managed Care – PPO | Admitting: Physician Assistant

## 2018-09-10 ENCOUNTER — Encounter: Payer: Self-pay | Admitting: Physician Assistant

## 2018-09-10 VITALS — BP 169/95 | HR 74 | Ht 60.05 in | Wt 181.2 lb

## 2018-09-10 DIAGNOSIS — K2100 Gastro-esophageal reflux disease with esophagitis, without bleeding: Secondary | ICD-10-CM

## 2018-09-10 DIAGNOSIS — F411 Generalized anxiety disorder: Secondary | ICD-10-CM | POA: Diagnosis not present

## 2018-09-10 DIAGNOSIS — M8580 Other specified disorders of bone density and structure, unspecified site: Secondary | ICD-10-CM

## 2018-09-10 HISTORY — DX: Other specified disorders of bone density and structure, unspecified site: M85.80

## 2018-09-10 NOTE — Progress Notes (Signed)
BP (!) 169/95   Pulse 74   Ht 5' 0.05" (1.525 m)   Wt 181 lb 3.2 oz (82.2 kg)   BMI 35.33 kg/m    Subjective:    Patient ID: Rita Diaz, female    DOB: 06/17/1956, 63 y.o.   MRN: [id number]  HPI: Rita Diaz is a 63 y.o. female presenting on 09/10/2018 for Anxiety (3 month ) and Gastroesophageal Reflux  This patient comes in for periodic recheck on medications and conditions including GERD, GAD, osteopenia  GERD: This patient comes in for recheck on her GERD and reports that it does not doing very well at all.  She has tried many things in the past.  She states she is taking the omeprazole 40 mg without any relief.  She still continues with some epigastric type pain.  She was evaluated by her general surgeon for her abdominal hernia repair.  She states that they gave her report but everything is in place.  So she does feels more internal about this time.  She occasionally experiences some esophageal symptoms.  She tries to eat very plain and blandly as much as possible  Generalized anxiety disorder, she states that overall she is fairly stable with this, condition she uses her alprazolam on a as needed basis.  She has a written that she can take it up to twice a day and she states she rarely ever does that.  She has tried multiple SSRIs in the past without any benefit.  We will continue with this therapy at this time.  Osteopenia: She comes in and we have discussed that her DEXA scan was performed last year and showed some osteopenia.  She is trying to do weightbearing exercises as much as possible.  Have her recheck in 1 year.  We will work and add prescription vitamin D at this time..   All medications are reviewed today. There are no reports of any problems with the medications. All of the medical conditions are reviewed and updated.  Lab work is reviewed and will be ordered as medically necessary. There are no new problems reported with today's visit.   Past Medical History:    Diagnosis Date  . Anxiety   . GERD (gastroesophageal reflux disease)    occ  . H/O hiatal hernia   . History of kidney stones   . Swelling    L SIDE OF BODY  . Ventral hernia    Relevant past medical, surgical, family and social history reviewed and updated as indicated. Interim medical history since our last visit reviewed. Allergies and medications reviewed and updated. DATA REVIEWED: CHART IN EPIC  Family History reviewed for pertinent findings.  Review of Systems  Constitutional: Negative.   HENT: Negative.   Eyes: Negative.   Respiratory: Negative.   Gastrointestinal: Positive for abdominal distention, abdominal pain and nausea. Negative for constipation, diarrhea, rectal pain and vomiting.  Genitourinary: Negative.   Musculoskeletal: Positive for arthralgias.    Allergies as of 09/10/2018      Reactions   Aspirin Other (See Comments)   Makes her burn all over.      Medication List       Accurate as of September 10, 2018  8:31 PM. Always use your most recent med list.        ALPRAZolam 0.5 MG tablet Commonly known as:  XANAX TAKE 1 TABLET (0.5 MG TOTAL) BY MOUTH 2 (TWO) TIMES DAILY AS NEEDED.   ANTIOXIDANT FORMULA PO Take by mouth.  ESTER C PO Take by mouth.   omeprazole 40 MG capsule Commonly known as:  PRILOSEC TAKE 1 CAPSULE BY MOUTH EVERY DAY          Objective:    BP (!) 169/95   Pulse 74   Ht 5' 0.05" (1.525 m)   Wt 181 lb 3.2 oz (82.2 kg)   BMI 35.33 kg/m   Allergies  Allergen Reactions  . Aspirin Other (See Comments)    Makes her burn all over.    Wt Readings from Last 3 Encounters:  09/10/18 181 lb 3.2 oz (82.2 kg)  07/31/18 183 lb 12.8 oz (83.4 kg)  06/06/18 180 lb 9.6 oz (81.9 kg)    Physical Exam Constitutional:      Appearance: She is well-developed.  HENT:     Head: Normocephalic and atraumatic.     Right Ear: Tympanic membrane, ear canal and external ear normal.     Left Ear: Tympanic membrane, ear canal and  external ear normal.     Nose: Nose normal. No rhinorrhea.     Mouth/Throat:     Pharynx: No oropharyngeal exudate or posterior oropharyngeal erythema.  Eyes:     Conjunctiva/sclera: Conjunctivae normal.     Pupils: Pupils are equal, round, and reactive to light.  Neck:     Musculoskeletal: Normal range of motion and neck supple.  Cardiovascular:     Rate and Rhythm: Normal rate and regular rhythm.     Heart sounds: Normal heart sounds.  Pulmonary:     Effort: Pulmonary effort is normal.     Breath sounds: Normal breath sounds.  Abdominal:     General: Bowel sounds are normal.     Palpations: Abdomen is soft.  Skin:    General: Skin is warm and dry.     Findings: No rash.  Neurological:     Mental Status: She is alert and oriented to person, place, and time.     Deep Tendon Reflexes: Reflexes are normal and symmetric.  Psychiatric:        Behavior: Behavior normal.        Thought Content: Thought content normal.        Judgment: Judgment normal.     Results for orders placed or performed in visit on 08/20/18  HM MAMMOGRAPHY  Result Value Ref Range   HM Mammogram 0-4 Bi-Rad 0-4 Bi-Rad, Self Reported Normal      Assessment & Plan:   1. Osteopenia, unspecified location DEXA in one year - VITAMIN D 25 Hydroxy (Vit-D Deficiency, Fractures)  2. Gastroesophageal reflux disease with esophagitis Uncontrolled She will be calling her gastroenterologist for a recheck  Continue current medications for now: omeprazole 40 mg one daily  3. GAD (generalized anxiety disorder) Continue alprazolam on prn basis   Continue all other maintenance medications as listed above.  Follow up plan: Return in about 4 months (around 01/09/2019). Narcotic agreement will be discussed. Educational handout given for GERD  Remus Loffler PA-C [facility] [address]  [place], [place] [address] [phone]   09/10/2018, 8:31 PM

## 2018-09-11 LAB — VITAMIN D 25 HYDROXY (VIT D DEFICIENCY, FRACTURES): Vit D, 25-Hydroxy: 37.2 ng/mL (ref 30.0–100.0)

## 2018-09-12 ENCOUNTER — Telehealth: Payer: Self-pay | Admitting: Physician Assistant

## 2018-09-12 NOTE — Telephone Encounter (Signed)
Patient informed she is to get OTC Vitamin D and start taking 2000 units daily

## 2018-09-12 NOTE — Telephone Encounter (Signed)
thenshe can just add on OTC vit D3 2000 IU daily

## 2018-09-12 NOTE — Telephone Encounter (Signed)
Patient looked at her mychart results of her vitamin d and it states she is to continue her vitamin d supplement.  She is not taking any vitamin d at this time.  Please advise.

## 2018-09-12 NOTE — Telephone Encounter (Signed)
Left message to call back  

## 2018-09-12 NOTE — Telephone Encounter (Signed)
Per Rita Diaz, patient is start vitamin d 2000 units daily.  Patient aware via voicemail.

## 2018-12-20 ENCOUNTER — Telehealth: Payer: Self-pay | Admitting: Physician Assistant

## 2018-12-20 NOTE — Telephone Encounter (Signed)
PT was put on amitpiptyline by a dr that did her CT Scan back in March (done in [place]), that dr told her to contact her PCP if she started to have any side effects. Pt states that she has had weight gain, constipation, and feels wobbly in mornings. Wants to know if she can stop taking it.

## 2018-12-20 NOTE — Telephone Encounter (Signed)
lmtcb

## 2018-12-20 NOTE — Telephone Encounter (Signed)
If she is having side effects she can stop medication. I will forward to PCP so she is aware and discuss alternatives with patient.

## 2018-12-20 NOTE — Telephone Encounter (Signed)
Yes, stop

## 2018-12-26 NOTE — Telephone Encounter (Signed)
Aware. 

## 2018-12-28 ENCOUNTER — Telehealth: Payer: Self-pay | Admitting: Physician Assistant

## 2018-12-28 NOTE — Telephone Encounter (Signed)
APPT SCHEDULED FOR TELEVISIT Pt would like to discuss weight gain due to Amitriptyline

## 2018-12-31 ENCOUNTER — Encounter: Payer: Self-pay | Admitting: Physician Assistant

## 2018-12-31 ENCOUNTER — Ambulatory Visit (INDEPENDENT_AMBULATORY_CARE_PROVIDER_SITE_OTHER): Payer: BC Managed Care – PPO | Admitting: Physician Assistant

## 2018-12-31 VITALS — Wt 189.0 lb

## 2018-12-31 DIAGNOSIS — K2100 Gastro-esophageal reflux disease with esophagitis, without bleeding: Secondary | ICD-10-CM

## 2018-12-31 DIAGNOSIS — R635 Abnormal weight gain: Secondary | ICD-10-CM | POA: Diagnosis not present

## 2018-12-31 DIAGNOSIS — T50905A Adverse effect of unspecified drugs, medicaments and biological substances, initial encounter: Secondary | ICD-10-CM

## 2018-12-31 DIAGNOSIS — K279 Peptic ulcer, site unspecified, unspecified as acute or chronic, without hemorrhage or perforation: Secondary | ICD-10-CM

## 2018-12-31 MED ORDER — PHENTERMINE HCL 37.5 MG PO TABS: 37.5000 mg | ORAL_TABLET | Freq: Every day | ORAL | 1 refills | Status: DC

## 2018-12-31 NOTE — Progress Notes (Signed)
    Telephone visit  Subjective: ZO:XWRUEA gain, hypersomnolence PCP: Terald Sleeper, PA-C [id number] T Erney is a 63 y.o. female calls for telephone consult today. Patient provides verbal consent for consult held via phone.  Patient is identified with 2 separate identifiers.  At this time the entire area is on COVID-19 social distancing and stay home orders are in place.  Patient is of higher risk and therefore we are performing this by a virtual method.  Location of patient: home Location of provider: HOME Others present for call: no  The patient states that back in March the gastroenterologist started her with amitriptyline 10 mg at bedtime.  She had severe sleepiness until approximately 11 AM each day.  She had to force herself to be awake.  She also has gained 12 pounds eating without very diligent efforts to lose weight.  She took the medicine until last week.  She states that her weight is 189 today.  We have discussed the use of some phentermine for appetite control prior to use over the next 3 to 6 months.  We will start with half a pill per day.  We have discussed the side effects of the medication.  We have also discussed the possibility of Topamax.   ROS: Per HPI  Allergies  Allergen Reactions  . Aspirin Other (See Comments)    Makes her burn all over.  Madelin Headings [Amitriptyline Hcl]     Hypersomnolence and weight gain   Past Medical History:  Diagnosis Date  . Anxiety   . GERD (gastroesophageal reflux disease)    occ  . H/O hiatal hernia   . History of kidney stones   . Swelling    L SIDE OF BODY  . Ventral hernia     Current Outpatient Medications:  .  ALPRAZolam (XANAX) 0.5 MG tablet, TAKE 1 TABLET (0.5 MG TOTAL) BY MOUTH 2 (TWO) TIMES DAILY AS NEEDED., Disp: 90 tablet, Rfl: 1 .  Bioflavonoid Products (ESTER C PO), Take by mouth., Disp: , Rfl:  .  Multiple Vitamin (ANTIOXIDANT FORMULA PO), Take by mouth., Disp: , Rfl:  .  omeprazole (PRILOSEC) 40 MG capsule,  TAKE 1 CAPSULE BY MOUTH EVERY DAY, Disp: 30 capsule, Rfl: 4 .  phentermine (ADIPEX-P) 37.5 MG tablet, Take 1 tablet (37.5 mg total) by mouth daily before breakfast., Disp: 30 tablet, Rfl: 1  Assessment/ Plan: 63 y.o. female   1. Gastroesophageal reflux disease with esophagitis Continue omeprazole 20 mg 1 daily  2. PUD (peptic ulcer disease) Continue omeprazole 20 mg 1 daily  3. Weight gain due to medication - phentermine (ADIPEX-P) 37.5 MG tablet; Take 1 tablet (37.5 mg total) by mouth daily before breakfast.  Dispense: 30 tablet; Refill: 1   Continue all other maintenance medications as listed above.  Start time: 11:15 AM End time: 11:24 AM  Meds ordered this encounter  Medications  . phentermine (ADIPEX-P) 37.5 MG tablet    Sig: Take 1 tablet (37.5 mg total) by mouth daily before breakfast.    Dispense:  30 tablet    Refill:  1    Order Specific Question:   Supervising Provider    Answer:   Janora Norlander [[id number]]    Particia Nearing PA-C [place] [phone]

## 2019-01-02 ENCOUNTER — Other Ambulatory Visit: Payer: Self-pay | Admitting: *Deleted

## 2019-01-02 DIAGNOSIS — K279 Peptic ulcer, site unspecified, unspecified as acute or chronic, without hemorrhage or perforation: Secondary | ICD-10-CM

## 2019-01-02 DIAGNOSIS — K2100 Gastro-esophageal reflux disease with esophagitis, without bleeding: Secondary | ICD-10-CM

## 2019-01-02 MED ORDER — OMEPRAZOLE 40 MG PO CPDR: DELAYED_RELEASE_CAPSULE | ORAL | 1 refills | Status: DC

## 2019-01-02 MED ORDER — ALPRAZOLAM 0.5 MG PO TABS: 0.5000 mg | ORAL_TABLET | Freq: Two times a day (BID) | ORAL | 1 refills | Status: DC | PRN

## 2019-01-08 ENCOUNTER — Other Ambulatory Visit: Payer: Self-pay | Admitting: *Deleted

## 2019-01-08 DIAGNOSIS — K279 Peptic ulcer, site unspecified, unspecified as acute or chronic, without hemorrhage or perforation: Secondary | ICD-10-CM

## 2019-01-08 DIAGNOSIS — K2100 Gastro-esophageal reflux disease with esophagitis, without bleeding: Secondary | ICD-10-CM

## 2019-01-08 MED ORDER — OMEPRAZOLE 40 MG PO CPDR: DELAYED_RELEASE_CAPSULE | ORAL | 1 refills | Status: DC

## 2019-01-08 MED ORDER — ALPRAZOLAM 0.5 MG PO TABS: 0.5000 mg | ORAL_TABLET | Freq: Two times a day (BID) | ORAL | 1 refills | Status: DC | PRN

## 2019-01-08 NOTE — Telephone Encounter (Signed)
We sent these RF to [facility] on 01/02/19, but need to go to [facility].  Please re-send.

## 2019-01-25 ENCOUNTER — Telehealth: Payer: Self-pay | Admitting: Physician Assistant

## 2019-01-25 ENCOUNTER — Other Ambulatory Visit: Payer: Self-pay | Admitting: Physician Assistant

## 2019-01-25 ENCOUNTER — Other Ambulatory Visit: Payer: Self-pay | Admitting: *Deleted

## 2019-01-25 DIAGNOSIS — K2100 Gastro-esophageal reflux disease with esophagitis, without bleeding: Secondary | ICD-10-CM

## 2019-01-25 DIAGNOSIS — K279 Peptic ulcer, site unspecified, unspecified as acute or chronic, without hemorrhage or perforation: Secondary | ICD-10-CM

## 2019-01-25 MED ORDER — OMEPRAZOLE 20 MG PO CPDR: DELAYED_RELEASE_CAPSULE | ORAL | 11 refills | Status: DC

## 2019-01-25 MED ORDER — OMEPRAZOLE 40 MG PO CPDR: DELAYED_RELEASE_CAPSULE | ORAL | 1 refills | Status: DC

## 2019-01-25 NOTE — Telephone Encounter (Signed)
done

## 2019-01-25 NOTE — Progress Notes (Signed)
Incoming fax from [facility]  refill requested RX not received in June Resubmitted to [facility] per Particia Nearing

## 2019-01-28 ENCOUNTER — Other Ambulatory Visit: Payer: Self-pay | Admitting: Physician Assistant

## 2019-01-28 DIAGNOSIS — R635 Abnormal weight gain: Secondary | ICD-10-CM

## 2019-02-18 ENCOUNTER — Other Ambulatory Visit: Payer: Self-pay | Admitting: Physician Assistant

## 2019-02-18 DIAGNOSIS — R635 Abnormal weight gain: Secondary | ICD-10-CM

## 2019-02-25 ENCOUNTER — Other Ambulatory Visit: Payer: Self-pay | Admitting: Physician Assistant

## 2019-02-25 DIAGNOSIS — R635 Abnormal weight gain: Secondary | ICD-10-CM

## 2019-02-26 NOTE — Telephone Encounter (Signed)
One month sent, needs appointment

## 2019-03-04 ENCOUNTER — Ambulatory Visit: Payer: BC Managed Care – PPO | Admitting: Physician Assistant

## 2019-03-29 ENCOUNTER — Other Ambulatory Visit: Payer: Self-pay

## 2019-04-01 ENCOUNTER — Ambulatory Visit: Payer: BC Managed Care – PPO | Admitting: Physician Assistant

## 2019-04-01 ENCOUNTER — Other Ambulatory Visit: Payer: Self-pay

## 2019-04-01 ENCOUNTER — Encounter: Payer: Self-pay | Admitting: Physician Assistant

## 2019-04-01 VITALS — BP 154/90 | HR 84 | Temp 99.3°F | Ht 60.05 in | Wt 161.0 lb

## 2019-04-01 DIAGNOSIS — T50905A Adverse effect of unspecified drugs, medicaments and biological substances, initial encounter: Secondary | ICD-10-CM

## 2019-04-01 DIAGNOSIS — R030 Elevated blood-pressure reading, without diagnosis of hypertension: Secondary | ICD-10-CM | POA: Diagnosis not present

## 2019-04-01 DIAGNOSIS — R635 Abnormal weight gain: Secondary | ICD-10-CM | POA: Diagnosis not present

## 2019-04-01 MED ORDER — HYDROCHLOROTHIAZIDE 25 MG PO TABS: 25.0000 mg | ORAL_TABLET | Freq: Every day | ORAL | 1 refills | Status: DC

## 2019-04-01 MED ORDER — PHENTERMINE HCL 37.5 MG PO TABS: 37.5000 mg | ORAL_TABLET | Freq: Every day | ORAL | 2 refills | Status: DC

## 2019-04-01 NOTE — Progress Notes (Signed)
BP (!) 154/90   Pulse 84   Temp 99.3 F (37.4 C) (Temporal)   Ht 5' 0.05" (1.525 m)   Wt 161 lb (73 kg)   SpO2 99%   BMI 31.39 kg/m    Subjective:    Patient ID: Rita Diaz, female    DOB: Sep 15, 1955, 63 y.o.   MRN: [id number]  HPI: Rita Diaz is a 63 y.o. female presenting on 04/01/2019 for No chief complaint on file.  This patient comes in for recheck on her weight loss efforts.  She had gained weight on medication which we stopped a few months ago.  And she has been working very hard on losing weight.  We have been giving her phentermine to use in the morning.  She has lost 30 pounds by her scale.  I have commended her on a very good job.  She is being very active.  She is avoiding carbonated drinks, drinking mostly water.  And she is eating good healthy things and smaller portions.  In reviewing her chart over the past year she is always had mildly elevated blood pressure usually 140 sometimes 150.  She still is a little bit elevated today.  We are going to have her try hydrochlorothiazide 25 mg 1 daily to see if this can lower blood pressure and plan to recheck her in 3 months  Past Medical History:  Diagnosis Date  . Anxiety   . GERD (gastroesophageal reflux disease)    occ  . H/O hiatal hernia   . History of kidney stones   . Swelling    L SIDE OF BODY  . Ventral hernia    Relevant past medical, surgical, family and social history reviewed and updated as indicated. Interim medical history since our last visit reviewed. Allergies and medications reviewed and updated. DATA REVIEWED: CHART IN EPIC  Family History reviewed for pertinent findings.  Review of Systems  Constitutional: Negative.   HENT: Negative.   Eyes: Negative.   Respiratory: Negative.   Gastrointestinal: Negative.   Genitourinary: Negative.     Allergies as of 04/01/2019      Reactions   Aspirin Other (See Comments)   Makes her burn all over.   Elavil [amitriptyline Hcl]    Hypersomnolence  and weight gain      Medication List       Accurate as of April 01, 2019 11:02 AM. If you have any questions, ask your nurse or doctor.        ALPRAZolam 0.5 MG tablet Commonly known as: XANAX Take 1 tablet (0.5 mg total) by mouth 2 (two) times daily as needed.   ANTIOXIDANT FORMULA PO Take by mouth.   ESTER C PO Take by mouth.   hydrochlorothiazide 25 MG tablet Commonly known as: HYDRODIURIL Take 1 tablet (25 mg total) by mouth daily. Started by: Terald Sleeper, PA-C   omeprazole 20 MG capsule Commonly known as: PRILOSEC TAKE 1 CAPSULE BY MOUTH EVERY DAY   phentermine 37.5 MG tablet Commonly known as: ADIPEX-P Take 1 tablet (37.5 mg total) by mouth daily before breakfast.          Objective:    BP (!) 154/90   Pulse 84   Temp 99.3 F (37.4 C) (Temporal)   Ht 5' 0.05" (1.525 m)   Wt 161 lb (73 kg)   SpO2 99%   BMI 31.39 kg/m   Allergies  Allergen Reactions  . Aspirin Other (See Comments)    Makes her burn  all over.  Caleb Popp. Elavil [Amitriptyline Hcl]     Hypersomnolence and weight gain    Wt Readings from Last 3 Encounters:  04/01/19 161 lb (73 kg)  12/31/18 189 lb (85.7 kg)  09/10/18 181 lb 3.2 oz (82.2 kg)    Physical Exam Constitutional:      General: She is not in acute distress.    Appearance: Normal appearance. She is well-developed.  HENT:     Head: Normocephalic and atraumatic.  Cardiovascular:     Rate and Rhythm: Normal rate.  Pulmonary:     Effort: Pulmonary effort is normal.  Skin:    General: Skin is warm and dry.     Findings: No rash.  Neurological:     Mental Status: She is alert and oriented to person, place, and time.     Deep Tendon Reflexes: Reflexes are normal and symmetric.     Results for orders placed or performed in visit on 09/10/18  VITAMIN D 25 Hydroxy (Vit-D Deficiency, Fractures)  Result Value Ref Range   Vit D, 25-Hydroxy 37.2 30.0 - 100.0 ng/mL      Assessment & Plan:   1. Weight gain due to  medication - phentermine (ADIPEX-P) 37.5 MG tablet; Take 1 tablet (37.5 mg total) by mouth daily before breakfast.  Dispense: 30 tablet; Refill: 2  2. Elevated blood pressure reading in office without diagnosis of hypertension - hydrochlorothiazide (HYDRODIURIL) 25 MG tablet; Take 1 tablet (25 mg total) by mouth daily.  Dispense: 90 tablet; Refill: 1   Continue all other maintenance medications as listed above.  Follow up plan: Return in about 3 months (around 07/01/2019).  Educational handout given for survey  Remus LofflerAngel S. Kearia Yin PA-C [facility] [address]  [place], [place] [id number] [phone]   04/01/2019, 11:02 AM

## 2019-04-22 ENCOUNTER — Encounter: Payer: Self-pay | Admitting: Physician Assistant

## 2019-04-22 ENCOUNTER — Ambulatory Visit (INDEPENDENT_AMBULATORY_CARE_PROVIDER_SITE_OTHER): Payer: BC Managed Care – PPO | Admitting: Physician Assistant

## 2019-04-22 ENCOUNTER — Telehealth: Payer: Self-pay | Admitting: Physician Assistant

## 2019-04-22 ENCOUNTER — Other Ambulatory Visit: Payer: Self-pay

## 2019-04-22 DIAGNOSIS — R21 Rash and other nonspecific skin eruption: Secondary | ICD-10-CM

## 2019-04-22 MED ORDER — CLOBETASOL PROPIONATE 0.05 % EX OINT: 1.0000 "application " | TOPICAL_OINTMENT | Freq: Two times a day (BID) | CUTANEOUS | 1 refills | Status: DC

## 2019-04-22 NOTE — Telephone Encounter (Signed)
Patient aware. She has a televisit.

## 2019-04-22 NOTE — Progress Notes (Signed)
425 pm     Telephone visit  Subjective: CC: Vaginal rash PCP: Terald Sleeper, PA-C BZJ:IRCVE [name] is a 63 y.o. female calls for telephone consult today. Patient provides verbal consent for consult held via phone.  Patient is identified with 2 separate identifiers.  At this time the entire area is on COVID-19 social distancing and stay home orders are in place.  Patient is of higher risk and therefore we are performing this by a virtual method.  Location of patient: Home Location of provider: [facility] Others present for call: No  Over the past week patient has had increasing irritation in the perineal area when she voids the urine does burn.  She does not feel like it is almost yeast because it does not have discharge.  She did work outside and get hot and does feel like it might of gotten chafed.  She denies any new products that she is used.  She has tried some over-the-counter medicines without any help.   ROS: Per HPI  Allergies  Allergen Reactions  . Aspirin Other (See Comments)    Makes her burn all over.  Madelin Headings [Amitriptyline Hcl]     Hypersomnolence and weight gain   Past Medical History:  Diagnosis Date  . Anxiety   . GERD (gastroesophageal reflux disease)    occ  . H/O hiatal hernia   . History of kidney stones   . Swelling    L SIDE OF BODY  . Ventral hernia     Current Outpatient Medications:  .  ALPRAZolam (XANAX) 0.5 MG tablet, Take 1 tablet (0.5 mg total) by mouth 2 (two) times daily as needed., Disp: 90 tablet, Rfl: 1 .  Bioflavonoid Products (ESTER C PO), Take by mouth., Disp: , Rfl:  .  clobetasol ointment (TEMOVATE) 9.38 %, Apply 1 application topically 2 (two) times daily., Disp: 30 g, Rfl: 1 .  hydrochlorothiazide (HYDRODIURIL) 25 MG tablet, Take 1 tablet (25 mg total) by mouth daily., Disp: 90 tablet, Rfl: 1 .  Multiple Vitamin (ANTIOXIDANT FORMULA PO), Take by mouth., Disp: , Rfl:  .  omeprazole (PRILOSEC) 20 MG capsule, TAKE 1 CAPSULE BY MOUTH  EVERY DAY, Disp: 30 capsule, Rfl: 11 .  phentermine (ADIPEX-P) 37.5 MG tablet, Take 1 tablet (37.5 mg total) by mouth daily before breakfast., Disp: 30 tablet, Rfl: 2 .  sulfamethoxazole-trimethoprim (BACTRIM DS) 800-160 MG tablet, Take 1 tablet by mouth 2 (two) times daily., Disp: 20 tablet, Rfl: 0  Assessment/ Plan: 63 y.o. female   1. Perineal rash in female - clobetasol ointment (TEMOVATE) 0.05 %; Apply 1 application topically 2 (two) times daily.  Dispense: 30 g; Refill: 1   No follow-ups on file.  Continue all other maintenance medications as listed above.  Start time: 4:25 PM End time: 4:34 PM  Meds ordered this encounter  Medications  . clobetasol ointment (TEMOVATE) 0.05 %    Sig: Apply 1 application topically 2 (two) times daily.    Dispense:  30 g    Refill:  1    Order Specific Question:   Supervising Provider    Answer:   Janora Norlander [[id number]]    Particia Nearing PA-C [place] [phone]

## 2019-04-23 ENCOUNTER — Other Ambulatory Visit: Payer: Self-pay | Admitting: Physician Assistant

## 2019-04-23 ENCOUNTER — Telehealth: Payer: Self-pay | Admitting: Physician Assistant

## 2019-04-23 MED ORDER — SULFAMETHOXAZOLE-TRIMETHOPRIM 800-160 MG PO TABS: 1.0000 | ORAL_TABLET | Freq: Two times a day (BID) | ORAL | 0 refills | Status: DC

## 2019-04-23 NOTE — Telephone Encounter (Signed)
Sent bactrim to th pharmacy

## 2019-04-23 NOTE — Telephone Encounter (Signed)
Left message- rx has been sent to the pharmacy.  

## 2019-06-03 ENCOUNTER — Other Ambulatory Visit: Payer: Self-pay | Admitting: Physician Assistant

## 2019-06-03 DIAGNOSIS — R635 Abnormal weight gain: Secondary | ICD-10-CM

## 2019-06-29 ENCOUNTER — Other Ambulatory Visit: Payer: Self-pay | Admitting: Physician Assistant

## 2019-06-29 DIAGNOSIS — R635 Abnormal weight gain: Secondary | ICD-10-CM

## 2019-07-05 ENCOUNTER — Encounter: Payer: Self-pay | Admitting: Physician Assistant

## 2019-07-05 ENCOUNTER — Ambulatory Visit (INDEPENDENT_AMBULATORY_CARE_PROVIDER_SITE_OTHER): Payer: BC Managed Care – PPO | Admitting: Physician Assistant

## 2019-07-05 VITALS — BP 162/80 | Wt 137.0 lb

## 2019-07-05 DIAGNOSIS — T50905A Adverse effect of unspecified drugs, medicaments and biological substances, initial encounter: Secondary | ICD-10-CM

## 2019-07-05 DIAGNOSIS — H9202 Otalgia, left ear: Secondary | ICD-10-CM

## 2019-07-05 DIAGNOSIS — H6122 Impacted cerumen, left ear: Secondary | ICD-10-CM

## 2019-07-05 DIAGNOSIS — R635 Abnormal weight gain: Secondary | ICD-10-CM | POA: Diagnosis not present

## 2019-07-05 MED ORDER — PHENTERMINE HCL 37.5 MG PO TABS: 37.5000 mg | ORAL_TABLET | Freq: Every day | ORAL | 0 refills | Status: DC

## 2019-07-05 NOTE — Progress Notes (Signed)
Telephone visit  Subjective: CC:ear pain PCP: Terald Sleeper, PA-C Rita Diaz is a 63 y.o. female calls for telephone consult today. Patient provides verbal consent for consult held via phone.  Patient is identified with 2 separate identifiers.  At this time the entire area is on COVID-19 social distancing and stay home orders are in place.  Patient is of higher risk and therefore we are performing this by a virtual method.  Location of patient: home Location of provider: [facility] Others present for call: no  Patient reports over the past week she has had increasing pain in her left ear.  When she is outside and then comes inside and will have a lot of pain and feels stopped up and decreased hearing.  She has had an issue with increased earwax in the past.  She denies seeing any drainage.  She is not having any sinus drainage.  She denies any fever or chills.  We have discussed that it is probably related to excessive earwax in her ear canal and she will get an over-the-counter kit to see if she can help clear out some of the wax.   ROS: Per HPI  Allergies  Allergen Reactions  . Aspirin Other (See Comments)    Makes her burn all over.  Madelin Headings [Amitriptyline Hcl]     Hypersomnolence and weight gain   Past Medical History:  Diagnosis Date  . Anxiety   . GERD (gastroesophageal reflux disease)    occ  . H/O hiatal hernia   . History of kidney stones   . Swelling    L SIDE OF BODY  . Ventral hernia     Current Outpatient Medications:  .  ALPRAZolam (XANAX) 0.5 MG tablet, Take 1 tablet (0.5 mg total) by mouth 2 (two) times daily as needed., Disp: 90 tablet, Rfl: 1 .  Bioflavonoid Products (ESTER C PO), Take by mouth., Disp: , Rfl:  .  clobetasol ointment (TEMOVATE) 0.10 %, Apply 1 application topically 2 (two) times daily., Disp: 30 g, Rfl: 1 .  hydrochlorothiazide (HYDRODIURIL) 25 MG tablet, Take 1 tablet (25 mg total) by mouth daily., Disp: 90 tablet, Rfl: 1 .   Multiple Vitamin (ANTIOXIDANT FORMULA PO), Take by mouth., Disp: , Rfl:  .  omeprazole (PRILOSEC) 20 MG capsule, TAKE 1 CAPSULE BY MOUTH EVERY DAY, Disp: 30 capsule, Rfl: 11 .  phentermine (ADIPEX-P) 37.5 MG tablet, Take 1 tablet (37.5 mg total) by mouth daily before breakfast., Disp: 30 tablet, Rfl: 0 .  sulfamethoxazole-trimethoprim (BACTRIM DS) 800-160 MG tablet, Take 1 tablet by mouth 2 (two) times daily., Disp: 20 tablet, Rfl: 0  Assessment/ Plan: 63 y.o. female   1. Left ear pain Debrox  2. Weight gain due to medication - phentermine (ADIPEX-P) 37.5 MG tablet; Take 1 tablet (37.5 mg total) by mouth daily before breakfast.  Dispense: 30 tablet; Refill: 0  3. Impacted cerumen of left ear debrox   Return in about 4 weeks (around 08/02/2019).  Continue all other maintenance medications as listed above.  Start time: 1:50 PM End time: 2:02 PM  Meds ordered this encounter  Medications  . phentermine (ADIPEX-P) 37.5 MG tablet    Sig: Take 1 tablet (37.5 mg total) by mouth daily before breakfast.    Dispense:  30 tablet    Refill:  0    Order Specific Question:   Supervising Provider    Answer:   Janora Norlander [[id number]]    Particia Nearing PA-C  [place] [phone]

## 2019-08-01 ENCOUNTER — Other Ambulatory Visit: Payer: Self-pay

## 2019-08-02 ENCOUNTER — Encounter: Payer: Self-pay | Admitting: Physician Assistant

## 2019-08-02 ENCOUNTER — Ambulatory Visit (INDEPENDENT_AMBULATORY_CARE_PROVIDER_SITE_OTHER): Payer: BC Managed Care – PPO | Admitting: Physician Assistant

## 2019-08-02 DIAGNOSIS — T50905A Adverse effect of unspecified drugs, medicaments and biological substances, initial encounter: Secondary | ICD-10-CM

## 2019-08-02 DIAGNOSIS — R030 Elevated blood-pressure reading, without diagnosis of hypertension: Secondary | ICD-10-CM | POA: Diagnosis not present

## 2019-08-02 DIAGNOSIS — R635 Abnormal weight gain: Secondary | ICD-10-CM | POA: Diagnosis not present

## 2019-08-02 MED ORDER — PHENTERMINE HCL 37.5 MG PO TABS: 37.5000 mg | ORAL_TABLET | Freq: Every day | ORAL | 2 refills | Status: DC

## 2019-08-02 NOTE — Progress Notes (Signed)
Acute Office Visit  Subjective:    Patient ID: Rita Diaz, female    DOB: 1955/09/09, 64 y.o.   MRN: [id number]  No chief complaint on file.   HPI  The patient comes in for recheck on her dietary efforts.  She has done excellently.  In the past year she has lost approximately 50+ pounds.  She has been taking the phentermine and tolerating it well.  It causes her to have less appetite.  And she eats much less amount and will only eat half of her meals when she eats out.  She is feeling quite good about the changes and is hopeful about continuing to lose.  Her blood pressure is very well controlled today.  She has kept a log on it and has not seen any significantly high readings.  We will just continue to watch this.  Past Medical History:  Diagnosis Date  . Anxiety   . GERD (gastroesophageal reflux disease)    occ  . H/O hiatal hernia   . History of kidney stones   . Swelling    L SIDE OF BODY  . Ventral hernia     Past Surgical History:  Procedure Laterality Date  . ABDOMINAL HERNIA REPAIR  1996, 2008  . ABDOMINAL HYSTERECTOMY  1989  . BLADDER SUSPENSION    . CESAREAN SECTION  1985  . CHOLECYSTECTOMY    . CYSTOSCOPY WITH RETROGRADE PYELOGRAM, URETEROSCOPY AND STENT PLACEMENT Left 02/09/2013   Procedure: CYSTOSCOPY WITH RETROGRADE PYELOGRAM, DIAGNOSTIC URETEROSCOPY AND STENT PLACEMENT ;  Surgeon: Sebastian Ache, MD;  Location: [facility];  Service: Urology;  Laterality: Left;  . HERNIA REPAIR     x 2  . INSERTION OF MESH  07/02/2014   Procedure: INSERTION OF MESH;  Surgeon: Manus Rudd, MD;  Location: [facility];  Service: General;;  . LAPAROSCOPIC LYSIS OF ADHESIONS N/A 09/19/2012   Procedure: LAPAROSCOPIC LYSIS OF ADHESIONS;  Surgeon: Wilmon Arms. Corliss Skains, MD;  Location: [facility];  Service: General;  Laterality: N/A;  . LAPAROSCOPY     x3  . MASS EXCISION N/A 05/29/2015   Procedure: EXCISION OF ABDOMINAL  MASS;  Surgeon: Manus Rudd, MD;  Location: [facility];   Service: General;  Laterality: N/A;  . VENTRAL HERNIA REPAIR  09/19/2012   Dr Corliss Skains  . VENTRAL HERNIA REPAIR N/A 07/02/2014   Procedure: HERNIA REPAIR VENTRAL ADULT WITH MESH;  Surgeon: Manus Rudd, MD;  Location: [facility];  Service: General;  Laterality: N/A;    Family History  Problem Relation Age of Onset  . Other Mother 36       nash  . Cancer Father 3       lung Cancer    Social History   Socioeconomic History  . Marital status: Married    Spouse name: Not on file  . Number of children: Not on file  . Years of education: Not on file  . Highest education level: Not on file  Occupational History  . Not on file  Tobacco Use  . Smoking status: Never Smoker  . Smokeless tobacco: Never Used  Substance and Sexual Activity  . Alcohol use: No  . Drug use: No  . Sexual activity: Not on file  Other Topics Concern  . Not on file  Social History Narrative  . Not on file   Social Determinants of Health   Financial Resource Strain:   . Difficulty of Paying Living Expenses: Not on file  Food Insecurity:   .  Worried About Programme researcher, broadcasting/film/video in the Last Year: Not on file  . Ran Out of Food in the Last Year: Not on file  Transportation Needs:   . Lack of Transportation (Medical): Not on file  . Lack of Transportation (Non-Medical): Not on file  Physical Activity:   . Days of Exercise per Week: Not on file  . Minutes of Exercise per Session: Not on file  Stress:   . Feeling of Stress : Not on file  Social Connections:   . Frequency of Communication with Friends and Family: Not on file  . Frequency of Social Gatherings with Friends and Family: Not on file  . Attends Religious Services: Not on file  . Active Member of Clubs or Organizations: Not on file  . Attends Banker Meetings: Not on file  . Marital Status: Not on file  Intimate Partner Violence:   . Fear of Current or Ex-Partner: Not on file  . Emotionally Abused: Not on file  . Physically Abused: Not  on file  . Sexually Abused: Not on file    Outpatient Medications Prior to Visit  Medication Sig Dispense Refill  . ALPRAZolam (XANAX) 0.5 MG tablet Take 1 tablet (0.5 mg total) by mouth 2 (two) times daily as needed. 90 tablet 1  . Cholecalciferol (VITAMIN D3) 50 MCG (2000 UT) TABS Take by mouth.    . clobetasol ointment (TEMOVATE) 0.05 % Apply 1 application topically 2 (two) times daily. 30 g 1  . hydrochlorothiazide (HYDRODIURIL) 25 MG tablet Take 1 tablet (25 mg total) by mouth daily. 90 tablet 1  . Multiple Vitamin (ANTIOXIDANT FORMULA PO) Take by mouth.    Marland Kitchen OVER THE COUNTER MEDICATION Energy, Metabolism, bone, Immune support    . vitamin B-12 (CYANOCOBALAMIN) 500 MCG tablet Take 500 mcg by mouth daily.    Marland Kitchen Bioflavonoid Products (ESTER C PO) Take by mouth.    Marland Kitchen omeprazole (PRILOSEC) 20 MG capsule TAKE 1 CAPSULE BY MOUTH EVERY DAY 30 capsule 11  . phentermine (ADIPEX-P) 37.5 MG tablet Take 1 tablet (37.5 mg total) by mouth daily before breakfast. 30 tablet 0  . sulfamethoxazole-trimethoprim (BACTRIM DS) 800-160 MG tablet Take 1 tablet by mouth 2 (two) times daily. 20 tablet 0   No facility-administered medications prior to visit.    Allergies  Allergen Reactions  . Aspirin Other (See Comments)    Makes her burn all over.  Caleb Popp [Amitriptyline Hcl]     Hypersomnolence and weight gain    Review of Systems  Constitutional: Negative.   HENT: Negative.   Eyes: Negative.   Respiratory: Negative.   Gastrointestinal: Negative.   Genitourinary: Negative.        Objective:    Physical Exam Constitutional:      General: She is not in acute distress.    Appearance: Normal appearance. She is well-developed.  HENT:     Head: Normocephalic and atraumatic.  Cardiovascular:     Rate and Rhythm: Normal rate.  Pulmonary:     Effort: Pulmonary effort is normal.  Skin:    General: Skin is warm and dry.     Findings: No rash.  Neurological:     Mental Status: She is alert  and oriented to person, place, and time.     Deep Tendon Reflexes: Reflexes are normal and symmetric.     BP 135/78   Pulse 87   Temp 99.3 F (37.4 C) (Temporal)   Ht 5' 0.05" (1.525 m)  Wt 136 lb 12.8 oz (62.1 kg)   SpO2 100%   BMI 26.67 kg/m  Wt Readings from Last 3 Encounters:  08/02/19 136 lb 12.8 oz (62.1 kg)  07/05/19 137 lb (62.1 kg)  04/01/19 161 lb (73 kg)    Health Maintenance Due  Topic Date Due  . HIV Screening  04/04/1971    There are no preventive care reminders to display for this patient.   Lab Results  Component Value Date   TSH 1.850 07/31/2018   Lab Results  Component Value Date   WBC 5.5 07/31/2018   HGB 14.3 07/31/2018   HCT 42.1 07/31/2018   MCV 88 07/31/2018   PLT 236 07/31/2018   Lab Results  Component Value Date   NA 146 (H) 07/31/2018   K 5.0 07/31/2018   CO2 25 07/31/2018   GLUCOSE 110 (H) 07/31/2018   BUN 17 07/31/2018   CREATININE 0.92 07/31/2018   BILITOT 0.3 07/31/2018   ALKPHOS 94 07/31/2018   AST 25 07/31/2018   ALT 23 07/31/2018   PROT 6.4 07/31/2018   ALBUMIN 4.2 07/31/2018   CALCIUM 9.3 07/31/2018   ANIONGAP 10 04/19/2017   Lab Results  Component Value Date   CHOL 181 07/31/2018   Lab Results  Component Value Date   HDL 73 07/31/2018   Lab Results  Component Value Date   LDLCALC 93 07/31/2018   Lab Results  Component Value Date   TRIG 73 07/31/2018   Lab Results  Component Value Date   CHOLHDL 2.5 07/31/2018   Lab Results  Component Value Date   HGBA1C 5.5 11/10/2017       Assessment & Plan:   Problem List Items Addressed This Visit      Other   Elevated blood pressure reading in office without diagnosis of hypertension    Other Visit Diagnoses    Weight gain due to medication       Relevant Medications   phentermine (ADIPEX-P) 37.5 MG tablet       Meds ordered this encounter  Medications  . phentermine (ADIPEX-P) 37.5 MG tablet    Sig: Take 1 tablet (37.5 mg total) by mouth daily  before breakfast.    Dispense:  30 tablet    Refill:  2    Order Specific Question:   Supervising Provider    Answer:   Janora Norlander [[id number]]     Terald Sleeper, PA-C

## 2019-08-28 ENCOUNTER — Other Ambulatory Visit: Payer: Self-pay | Admitting: Physician Assistant

## 2019-08-28 ENCOUNTER — Telehealth: Payer: Self-pay | Admitting: *Deleted

## 2019-08-28 MED ORDER — ONDANSETRON HCL 4 MG PO TABS: 4.0000 mg | ORAL_TABLET | Freq: Three times a day (TID) | ORAL | 0 refills | Status: AC | PRN

## 2019-08-28 NOTE — Telephone Encounter (Signed)
Fax from [facility] Request Ondansetron 4 mg 1 Q8hr prn Not on current med list Last OV 08/02/19 Next Ov 11/01/19

## 2019-08-28 NOTE — Telephone Encounter (Signed)
Prescription of ondansetron is sent to the pharmacy

## 2019-08-28 NOTE — Telephone Encounter (Signed)
Patient aware.

## 2019-10-03 ENCOUNTER — Other Ambulatory Visit: Payer: Self-pay | Admitting: Physician Assistant

## 2019-10-03 DIAGNOSIS — R030 Elevated blood-pressure reading, without diagnosis of hypertension: Secondary | ICD-10-CM

## 2019-10-29 ENCOUNTER — Other Ambulatory Visit: Payer: Self-pay | Admitting: Family Medicine

## 2019-10-29 ENCOUNTER — Telehealth: Payer: Self-pay | Admitting: Family Medicine

## 2019-10-29 DIAGNOSIS — R635 Abnormal weight gain: Secondary | ICD-10-CM

## 2019-10-29 NOTE — Telephone Encounter (Signed)
Left detailed message will need office visit for this refill.

## 2019-10-29 NOTE — Telephone Encounter (Signed)
°  Prescription Request  10/29/2019  What is the name of the medication or equipment? phentermine (ADIPEX-P) 37.5 MG tablet  Have you contacted your pharmacy to request a refill? (if applicable) no --pt has apt with Nadine Counts 12/12/2019 had to be rescheduled because she is AJ pt  Which pharmacy would you like this sent to? [facility]    Patient notified that their request is being sent to the clinical staff for review and that they should receive a response within 2 business days.

## 2019-10-30 ENCOUNTER — Telehealth: Payer: Self-pay | Admitting: Physician Assistant

## 2019-10-30 NOTE — Telephone Encounter (Signed)
Patient is upset that she can not get Adipex with out being seen. She called 3 times and was told she could not get it with out being seen by a provider. She got mad and stated she will just find a different doctor. Asked patient is she would like to speak with a manager she declined.

## 2019-10-30 NOTE — Telephone Encounter (Signed)
°  Prescription Request  10/30/2019  What is the name of the medication or equipment? phentermine (ADIPEX-P) 37.5 MG tablet [Pharmacy Med Name: phentermine 37.5 mg tablet]  Have you contacted your pharmacy to request a refill? (if applicable) YES  Which pharmacy would you like this sent to? [facility] pt has appt with DR. Reece Agar on 12/12/2019 she will run out 11/01/2019   Patient notified that their request is being sent to the clinical staff for review and that they should receive a response within 2 business days.

## 2019-10-30 NOTE — Telephone Encounter (Signed)
Patient aware that she will need to be seen in office for a refill on this medication.  Patient states that she has been on this phentermine for a year and doesn't want to go without.  Advised patient that phentermine is usually only prescribed for a 3 month period.  Advised patient first available appt with Dr. Nadine Counts isnt until 5/27.  Patient verbalized understanding

## 2019-11-01 ENCOUNTER — Telehealth: Payer: Self-pay | Admitting: Physician Assistant

## 2019-11-01 ENCOUNTER — Ambulatory Visit: Payer: BC Managed Care – PPO | Admitting: Physician Assistant

## 2019-11-01 NOTE — Telephone Encounter (Signed)
Pt called back - she was very polite. Appt date was changed. Aware of controlled med policy.

## 2019-11-01 NOTE — Telephone Encounter (Signed)
LM for pt to call me back - aware on VM that she will have to be seen - we may can work on changing her appt date to a sooner time if needed - JHBullins, LPN

## 2019-11-01 NOTE — Telephone Encounter (Signed)
Pt calling back again about phentermine (ADIPEX-P) 37.5 MG tablet. She says that she is out today and has an apt scheduled to come in to see AG 12/12/2019. Please call back

## 2019-11-01 NOTE — Telephone Encounter (Signed)
I cannot offer her controlled substance without seeing her face-to-face as per office and federal policy.

## 2019-11-01 NOTE — Telephone Encounter (Signed)
Please advise 

## 2019-11-11 ENCOUNTER — Encounter: Payer: Self-pay | Admitting: Family Medicine

## 2019-11-11 ENCOUNTER — Ambulatory Visit: Payer: BC Managed Care – PPO | Admitting: Family Medicine

## 2019-11-11 ENCOUNTER — Other Ambulatory Visit: Payer: Self-pay

## 2019-11-11 VITALS — BP 108/67 | HR 91 | Temp 97.8°F | Ht 60.0 in | Wt 130.0 lb

## 2019-11-11 DIAGNOSIS — Z79899 Other long term (current) drug therapy: Secondary | ICD-10-CM | POA: Diagnosis not present

## 2019-11-11 DIAGNOSIS — E663 Overweight: Secondary | ICD-10-CM | POA: Diagnosis not present

## 2019-11-11 NOTE — Progress Notes (Signed)
Subjective: CC: est care, f/u weight, GAD PCP: Janora Norlander, DO KGM:WNUUV T Gaetz is a 64 y.o. female presenting to clinic today for:  1. GAD with panic Patient reports intermittent panic attack.  She reports these only occur a couple of times a year and that she still has plenty of her alprazolam leftover.  She only takes about a half a tablet at a time because otherwise she is excessively sedated.  Denies any alcohol use, drug use.  2.  Overweight Patient reports that she was morbidly obese about a year ago after being started on a medication for her stomach.  She had gained a substantial amount of weight and was subsequently started on phentermine.  She has since discontinued that medication and remained on phentermine over the last year up until about a week and a half ago.  She reports greater than 50 pound weight loss.  She notes that she is physically active and tries to work outside.  She pushes her own lawn more and mows her lawn independently.  She modifies her portion sizes such that she is only eating small portions throughout the day.  She hydrates well.  Since discontinuing the phentermine she has noticed a little bit of fluid retention and has subsequently used her hydrochlorothiazide which has helped.  Denies any dizziness or falls.   ROS: Per HPI  Allergies  Allergen Reactions  . Aspirin Other (See Comments)    Makes her burn all over.  Madelin Headings [Amitriptyline Hcl]     Hypersomnolence and weight gain   Past Medical History:  Diagnosis Date  . Anxiety   . GERD (gastroesophageal reflux disease)    occ  . H/O hiatal hernia   . History of kidney stones   . Swelling    L SIDE OF BODY  . Ventral hernia     Current Outpatient Medications:  .  ALPRAZolam (XANAX) 0.5 MG tablet, Take 1 tablet (0.5 mg total) by mouth 2 (two) times daily as needed., Disp: 90 tablet, Rfl: 1 .  Cholecalciferol (VITAMIN D3) 50 MCG (2000 UT) TABS, Take by mouth., Disp: , Rfl:  .   clobetasol ointment (TEMOVATE) 2.53 %, Apply 1 application topically 2 (two) times daily., Disp: 30 g, Rfl: 1 .  hydrochlorothiazide (HYDRODIURIL) 25 MG tablet, TAKE ONE TABLET BY MOUTH DAILY, Disp: 90 tablet, Rfl: 0 .  Multiple Vitamin (ANTIOXIDANT FORMULA PO), Take by mouth., Disp: , Rfl:  .  ondansetron (ZOFRAN) 4 MG tablet, Take 1 tablet (4 mg total) by mouth every 8 (eight) hours as needed for nausea or vomiting., Disp: 30 tablet, Rfl: 0 .  OVER THE COUNTER MEDICATION, Energy, Metabolism, bone, Immune support, Disp: , Rfl:  .  phentermine (ADIPEX-P) 37.5 MG tablet, Take 1 tablet (37.5 mg total) by mouth daily before breakfast., Disp: 30 tablet, Rfl: 2 .  vitamin B-12 (CYANOCOBALAMIN) 500 MCG tablet, Take 500 mcg by mouth daily., Disp: , Rfl:  Social History   Socioeconomic History  . Marital status: Married    Spouse name: Not on file  . Number of children: Not on file  . Years of education: Not on file  . Highest education level: Not on file  Occupational History  . Not on file  Tobacco Use  . Smoking status: Never Smoker  . Smokeless tobacco: Never Used  Substance and Sexual Activity  . Alcohol use: No  . Drug use: No  . Sexual activity: Not on file  Other Topics Concern  .  Not on file  Social History Narrative  . Not on file   Social Determinants of Health   Financial Resource Strain:   . Difficulty of Paying Living Expenses:   Food Insecurity:   . Worried About Charity fundraiser in the Last Year:   . Arboriculturist in the Last Year:   Transportation Needs:   . Film/video editor (Medical):   Marland Kitchen Lack of Transportation (Non-Medical):   Physical Activity:   . Days of Exercise per Week:   . Minutes of Exercise per Session:   Stress:   . Feeling of Stress :   Social Connections:   . Frequency of Communication with Friends and Family:   . Frequency of Social Gatherings with Friends and Family:   . Attends Religious Services:   . Active Member of Clubs or  Organizations:   . Attends Archivist Meetings:   Marland Kitchen Marital Status:   Intimate Partner Violence:   . Fear of Current or Ex-Partner:   . Emotionally Abused:   Marland Kitchen Physically Abused:   . Sexually Abused:    Family History  Problem Relation Age of Onset  . Other Mother 71       nash  . Cancer Father 52       lung Cancer    Objective: Office vital signs reviewed. BP 108/67   Pulse 91   Temp 97.8 F (36.6 C)   Ht 5' (1.524 m)   Wt 130 lb (59 kg)   SpO2 96%   BMI 25.39 kg/m   Physical Examination:  General: Awake, alert, well nourished, well appearing. No acute distress HEENT: Normal, sclera white, MMM Cardio: regular rate and rhythm, S1S2 heard, no murmurs appreciated Pulm: clear to auscultation bilaterally, no wheezes, rhonchi or rales; normal work of breathing on room air Extremities: warm, well perfused, No edema, cyanosis or clubbing; +2 pulses bilaterally MSK: normal gait and station Psych: mood stable, speech normal. Affect appropriate Depression screen [facility] 2/9 11/11/2019 08/02/2019 04/01/2019 09/10/2018 07/31/2018  Decreased Interest 0 0 0 0 0  Down, Depressed, Hopeless 0 0 0 0 0  PHQ - 2 Score 0 0 0 0 0  Altered sleeping - - - - -  Tired, decreased energy - - - - -  Change in appetite - - - - -  Feeling bad or failure about yourself  - - - - -  Trouble concentrating - - - - -  Moving slowly or fidgety/restless - - - - -  Suicidal thoughts - - - - -  PHQ-9 Score - - - - -     Assessment/ Plan: 64 y.o. female   1. Long-term current use of stimulant She is off of the phentermine and seems to be doing well.  She is noticed a little bit of weight gain and fluid since discontinuation but overall maintains a healthy lifestyle.  I think that the phentermine was likely playing a very small part in maintaining her weight at this point.  She is done an excellent job at portion control and staying physically active and I do not anticipate that she is going to need  the phentermine again.  We discussed that indication for phentermine going forward would be a BMI above 30 which would be about 154 pounds for this patient.  We certainly could revisit the medicine if she needed to in the future but given her current lifestyle I doubt that we will need it.  She seems satisfied  with this.  Encouraged her to follow-up with me if needed going forward or for her annual physical with fasting labs.  2. Overweight (BMI 25.0-29.9) She is barely overweight at this point.  Phentermine certainly not indicated.  Advised her to watch blood pressures with use of hydrochlorothiazide.  Consider using only half a tablet if needed for edema.  Make sure that she is adequately hydrating particularly if she is working outside.  Caution sunburn.  Use sunscreen.    Orders Placed This Encounter  Procedures  . CMP14+EGFR   No orders of the defined types were placed in this encounter.    Janora Norlander, DO [place] [phone]

## 2019-11-11 NOTE — Patient Instructions (Addendum)
"  Phentermine Hydrochloride Tablets are indicated as a short-term (a few weeks) adjunct in a regimen of weight reduction based on exercise, behavioral modification and caloric restriction in the management of exogenous obesity for patients with an initial body mass index ? 30 kg/m2, or ? 27 kg/m2 in the presence of other risk factors (e.g., controlled hypertension, diabetes, hyperlipidemia)."  Long term use can cause adverse cardiovascular side effects.  Luckily, you done such an AMAZING job at watching your diet and staying physically active that you don't need this medication anymore.  I bet this medication wasn't really even doing anything at this point.  If you start gaining weight again and BMI goes above 30, we can definitely go back on the phentermine short term.  Hopefully, given how strict you are about your diet you won't even have a need for it.  You'd have to be 154lbs in order to need the phentermine again.  Keep an eye on your blood pressure with the water pill.  It's on the low side of normal today.  It can cause dehydration and sunburn so be careful when using and doing work outside.  Make sure you're wearing sunblock and drinking water  See me in 6 months, sooner if needed and we'll do your full physical with fasting labs.

## 2019-11-13 LAB — TOXASSURE SELECT 13 (MW), URINE

## 2019-12-04 ENCOUNTER — Ambulatory Visit: Payer: BC Managed Care – PPO | Admitting: Family Medicine

## 2019-12-12 ENCOUNTER — Ambulatory Visit: Payer: BC Managed Care – PPO | Admitting: Family Medicine

## 2020-01-09 ENCOUNTER — Telehealth: Payer: Self-pay | Admitting: Family Medicine

## 2020-01-09 NOTE — Telephone Encounter (Signed)
This behavior is not acceptable.  If I recall correctly, she was inappropriate with staff prior to our last visit as well.  I think it would be in her best interest to seek out a different provider, perhaps someone who is connected with an urgent care that may be more easily able to see her at her convenience.  I believe that [facility] has this capability, not sure who does locally.  I will cc: [facility].

## 2020-01-09 NOTE — Telephone Encounter (Signed)
I spoke to pt and offered her an appt with you on 01/22/20 but she will be out of town- they are leaving prior to the 4th and she wanted to be seen now. Offered pt the next thing I could find with you which was 01/28/20 but pt said she couldn't wait that long. She said you told her to call back if her stomach issues continued so she wants something done now and then hung up on me.

## 2020-01-17 ENCOUNTER — Encounter: Payer: Self-pay | Admitting: Family Medicine

## 2020-01-18 ENCOUNTER — Other Ambulatory Visit: Payer: Self-pay | Admitting: Family Medicine

## 2020-01-18 DIAGNOSIS — R030 Elevated blood-pressure reading, without diagnosis of hypertension: Secondary | ICD-10-CM

## 2020-02-03 ENCOUNTER — Telehealth: Payer: Self-pay | Admitting: Family Medicine

## 2020-02-03 NOTE — Telephone Encounter (Signed)
Rita Diaz would like you to call her regarding Toxassure testing done on 11/11/19. She states she did not have any labs done that day. She states she did not leave a urine sample. She would like for you to call her.

## 2020-02-04 NOTE — Telephone Encounter (Signed)
I attempted to call at 12:07pm with no answer.  She indeed did leave a sample.  It would not have been processed if her name was not on it.  She left it in anticipation of needing controlled substance refills.  The UDS was appropriate.  Not sure what her question is about this.  She in fact did not have blood labs done that day though.

## 2020-02-05 ENCOUNTER — Telehealth: Payer: Self-pay | Admitting: Family Medicine

## 2020-02-05 NOTE — Telephone Encounter (Signed)
I attempted to reach this patient at 9:22am after receiving a letter asking that we discuss recent events.  Will cc to office manager.

## 2020-02-07 ENCOUNTER — Ambulatory Visit: Payer: BC Managed Care – PPO | Admitting: Family Medicine

## 2020-02-26 DIAGNOSIS — R600 Localized edema: Secondary | ICD-10-CM | POA: Insufficient documentation

## 2020-02-26 DIAGNOSIS — I10 Essential (primary) hypertension: Secondary | ICD-10-CM | POA: Insufficient documentation

## 2020-04-28 ENCOUNTER — Telehealth: Payer: Self-pay

## 2020-04-28 NOTE — Telephone Encounter (Signed)
Patient requested chart amendment on 04/09/20 for date of service 11/11/19.  Chart amendment for Urine Drug Screen has been denied by provider.  All paperwork forwarded to Dennie Maizes for documentation.

## 2020-11-02 DIAGNOSIS — M546 Pain in thoracic spine: Secondary | ICD-10-CM | POA: Insufficient documentation

## 2024-04-22 ENCOUNTER — Encounter: Payer: Self-pay | Admitting: Physician Assistant

## 2024-05-03 NOTE — Progress Notes (Signed)
 Subjective:   Rita Diaz is a 68 y.o. female here for evaluation of    Chief Complaint  Patient presents with   Back Pain    Patient states she has had back pain for the past few months after falling down 2 flights of stairs.    Reports sharp shooting pains up into traps. Denies numbness/tingling to arms or legs. Tenderness to spine at top of thoracic through lower lumbar. Reduced ROM secondary to pain.  Review of Systems - All other systems were reviewed and are negative unless stated in HPI.  Family History  Problem Relation Age of Onset   Colon polyps Mother    Heart disease Mother    Lung cancer Father    Colon cancer Neg Hx    Breast cancer Neg Hx    Ovarian cancer Neg Hx    Liver cancer Neg Hx    Rectal cancer Neg Hx    Stomach cancer Neg Hx    Past Medical History:  Diagnosis Date   Anxiety    GERD (gastroesophageal reflux disease)    Hypertension    Past Surgical History:  Procedure Laterality Date   Bladder tacked twice     Cesarean section     Cholecystectomy     Colonoscopy  07/08/2015   no adenomas, f/u 10 yrs, Murphy/GAP   Ercp     normal, Fina, early 64's?   Esophagus surgery  1996   Nissen Fundoplication, Koontz   Hernia repair     x4   Hernia surgery x 2     Hysterectomy     Kidney stone removal x 2     Scar tissue removal     Upper gastrointestinal endoscopy  02/24/2014   Nissen intact, normal, Murphy/GAP   Pediatric History  Patient Parents   Not on file   Other Topics Concern   Not on file  Social History Narrative   Not on file    Objective:   BP 138/63 (BP Location: Left Upper Arm, Patient Position: Sitting)   Pulse 82   Temp 99.3 F (37.4 C) (Temporal)   Resp 20   Ht 5' 4 (1.626 m)   Wt 177 lb 3.2 oz (80.4 kg)   SpO2 96%   Breastfeeding No   BMI 30.42 kg/m  Gen: Alert, oriented, non toxic, and well hydrated.  No signs of acute distress. Head: Normocephalic.  Atraumatic.  PERRLA.   Sclera anicteric.  Eyes: Extraocular movements intact.  Conjunctiva clear.  No foreign bodies noted. Ears:  Tympanic membranes clear.  Canals clear. Pharynx: No erythema or tonsillar hypertrophy.  Uvula midline. Neck: Supple. No lymphadenopathy Respiratory:  Lungs clear to auscultation.  No use of accessory muscles. Cardiovascular: Regular rate and rhythm.  No murmurs noted Abdominal:  Soft, non tender, non distended.  No hepatosplenomegally Neuro: Cranial nerves intact grossly.  No loss of strength, sensation Extremities:  Full range of motion.  No cyanosis, clubbing, or edema. Skin:  No rashes noted Psych: Oriented, alert.  Assessment:     ICD-10-CM   1. Back pain, unspecified back location, unspecified back pain laterality, unspecified chronicity  M54.9 XR Spine  Thoracic 2 Views    XR Spine  Lumbar 2-3 Views      Plan:   - Take all medications as prescribed. - Return to clinic to be reevaluated if symptoms worsen, persist, change, or if you have any other concerns. - I discussed this diagnosis with the patient and discussed the treatment plan  with them.  This treatment plan is also outlined in the Patient Instructions and a copy of this was provided to the patient.

## 2024-05-15 DIAGNOSIS — M47816 Spondylosis without myelopathy or radiculopathy, lumbar region: Secondary | ICD-10-CM | POA: Insufficient documentation

## 2024-05-15 DIAGNOSIS — M5136 Other intervertebral disc degeneration, lumbar region with discogenic back pain only: Secondary | ICD-10-CM | POA: Insufficient documentation

## 2024-05-27 NOTE — Progress Notes (Signed)
"   °  Medicare AWV  Rita Diaz is a 68 y.o. female who presents for her subsequent annual wellness visit for Medicare.  Clinical documentation was reviewed and is accessible via encounter-level attachments.    Any physical exam components or additional concerns beyond the scope of the Annual Wellness Visit may be documented in a separate note within this encounter.  Medicare Required Components     Reviewed and updated this visit by provider: Tobacco  Allergies  Meds  Problems  Med Hx  Surg Hx  Fam Hx       Medicare required assessment: Future risk of substance use disorder / overdose risk of 12% is typical (1.3-20%).    Patient Care Team: Aldona LITTIE Pizza, NP as PCP - General (Family Medicine) Lonni DELENA Charleston, MD as Pain Management Care Supervisor (Pain Medicine) Donnice Lima, MD as Consulting Physician (General Surgery) Bernardino LITTIE Bitters, MD as Consulting Physician (Gastroenterology)  Vitals   Vitals:   05/27/24 1045  BP: (!) 185/76  Patient Position: Sitting  Pulse: 61  Temp: 98 F (36.7 C)  TempSrc: Temporal  Resp: 20  Height: 5' 4 (1.626 m)  Weight: 184 lb (83.5 kg)  SpO2: 97%  BMI (Calculated): 31.6  PainSc: 0-No pain    Disposition   1. Medicare annual wellness visit, subsequent (Primary) 2. Well adult exam -     CBC And Differential; Future -     TSH Reflex T4Free/T3 Hormone; Future -     CMP14 (Reflex HGB A1c); Future 3. Primary hypertension -     CBC And Differential; Future -     lisinopril (PRINIVIL,ZESTRIL) 10 mg tablet; Take one tablet (10 mg dose) by mouth daily. Take in PM, Starting Mon 05/27/2024, Normal -     lisinopril (PRINIVIL,ZESTRIL) 20 mg tablet; Take one tablet (20 mg dose) by mouth daily. In AM, Starting Mon 05/27/2024, Normal 4. GAD (generalized anxiety disorder) -     CBC And Differential; Future -     buPROPion HCl (WELLBUTRIN XL) 150 mg 24 hr tablet; Take one tablet (150 mg dose) by mouth every morning., Starting Mon  05/27/2024, Normal 5. Memory changes -     CBC And Differential; Future -     TSH Reflex T4Free/T3 Hormone; Future 6. Pre-diabetes -     CBC And Differential; Future -     CMP14 (Reflex HGB A1c); Future 7. Screening for lipid disorders -     Lipid Panel With LDL/HDL Ratio; Future 8. Paranoia (*) -     ARIPiprazole (ABILIFY) 5 mg tablet; Take one tablet (5 mg dose) by mouth daily., Starting Mon 05/27/2024, Normal 9. Gastroesophageal reflux disease, unspecified whether esophagitis present -     pantoprazole  sodium (PROTONIX ) 40 mg tablet; Take one tablet (40 mg dose) by mouth daily., Starting Mon 05/27/2024, Normal   No follow-ups on file.   Health maintenance issues were discussed with the patient.  A written plan was provided to the patient in the form of patient instructions in the After Visit Summary document.     "
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
## 2024-06-21 NOTE — Progress Notes (Signed)
 [facility] Telephonic Visit NON VIDEO VISIT   Patient ID:  Rita Diaz is a 68 y.o. (DOB Nov 19, 1955) female  Patient has been advised as to the limitations and limited nature of physical exam due to nature of a telephone visit, the possibility of privacy risk in the use of a telephone visit, and that the healthcare provider may recommend visiting a healthcare clinic for in-person care and follow up.  Telephonic Visit Assessment and Plan   1. Acute non-recurrent maxillary sinusitis (Primary) -     amoxicillin -clavulanate (AUGMENTIN ) 875-125 mg per tablet; Take one tablet by mouth 2 (two) times daily for 10 days., Starting Fri 06/21/2024, Until Mon 07/01/2024, Normal    Augmentin  for sinus infection due to length of symptoms.  May take OTC meds. Stay hydrated. - RTC if no improvement Patient  verbalized to me that they understood what their problem is, what they need to do about it, and why it is important that they do it.  The patient/family voices understanding of all medications. No barriers to adherence were noted. Patient is taking all medications as prescribed and is tolerating well.  Plan for follow-up as discussed or as needed if any worsening symptoms or change in condition.    Risk, benefits, and alternatives were provided through patient instructions given to the patient during the telephone interaction.  If any worsening symptoms or lack of improvement, the patient will seek immediate medical care.  Telephonic Visit History      Patient presents with   Sinusitis     Feels she has a sinus infection. Has been going on 3-4 weeks. When she bends over she will have fluid run out of her nose. She is coughing. Is not sleeping well d/t cough. Taking mucinex, sinus med and no relief. Denies fever, body aches, only ear and head pain. Has mucous in her throat. Sore throat intermittently.   Reviewed and updated this visit by provider: Tobacco  Allergies  Meds  Problems   Med Hx  Surg Hx  Fam Hx        ROS:  As documented in the history above, all other relevant system complaints were negative.  Telephonic Visit Objective Findings   This is a telephone visit. Examination conducted without the use of video cameras/computer monitors. Vital signs and other aspects of physical exam are limited due to the nature of this encounter.   Constitutional:  No apparent acute distress noted during the telephone interaction; Alert and verbally interactive. Mood:  Appears appropriate to situation.  Total time spent (established patient): 10 minutes

## 2024-07-08 NOTE — Progress Notes (Signed)
 History of Present Illness The patient is a 68 year old female who presents complaining of sinus issues for several weeks. She has a history of gastroesophageal reflux, hypertension, and generalized anxiety.  She has been experiencing a sinus infection for the past 7 weeks. She reports discolored discharge and has been coughing up green phlegm. The cough is severe enough to cause gagging and disrupt her sleep. She has no known allergies to antibiotics. She has been using a humidifier at home during the night to alleviate nasal congestion. She was prescribedaugmentin amoxicillin  approximately 2 to 3 weeks ago.  Vitals:   07/08/24 1249  BP: 139/73  Pulse: 78  Resp: 16  Temp: 98 F (36.7 C)  SpO2: 97%   Gen: Alert, oriented, non toxic, and well hydrated.  No signs of acute distress. HEENT: Normocephalic.  Atraumatic.  PERRLA.   Neck: Supple. No lymphadenopathy Respiratory:  No use of accessory muscles. Cardiovascular: Regular rate and rhythm.   Abdominal:  Soft, non tender, non distended.    Neuro: Cranial nerves intact grossly.  No loss of strength, sensation MS:  Full range of motion.  No cyanosis, clubbing, or edema. Skin:  No rashes noted Psych: Oriented, alert.  Assessment & Plan  1. Subacute frontal sinusitis       MDM:   1. Recurrent sinusitis: - Symptoms include discolored discharge and coughing up green phlegm, persisting for 7 weeks. - Completed a course of augmentin  without improvement. - A trial of Levaquin has been prescribed to see if it clears the infection. - If symptoms persist, a CT scan of the sinuses may be necessary.  Patient  verbalized to me that they understood what their problem is, what they need to do about it, and why it is important that they do it.  The patient/family voices understanding of all medications. No barriers to adherence were noted. Patient is taking all medications as prescribed and is tolerating well.  Plan for follow-up as discussed or  as needed if any worsening symptoms or change in condition.

## 2024-07-09 NOTE — Progress Notes (Signed)
 "       Rita Diaz is a delightful 68 y.o. year old RH female with a history of hypertension, recurrent sinusitis, currently ongoing being treated with Levaquin, chronic pain, GERD GAD, recent presentation to the hospital with paranoia and confusion seen today for evaluation of memory loss. MoCA today is 17/30. Etiology is unclear, although AD is on the differential. No tremors or other parkinsonian signs are noted on exam today to suspect LBD. MRI of the brain performed at [facility] 05/15/2024 (no disc) remarkable for mild cerebral atrophy with disproportionate atrophy seen in the right medial temporal lobe hippocampi, moderate chronic small vessel ischemic disease, no acute intracranial pathologies. She is already on donepezil 10 mg daily as per PCP. Will proceed with further workup. Patient is able to participate on ADLs . Mood is anxious and tearful.  Neuropsychological evaluation for further investigate other causes of memory loss including sleep, attention, anxiety, depression, underlying psychiatric issues,or neurodegenerative disease   Follow up  pending on the above results, we may entertain LP to rule out AD.   Continue donepezil 10 mg daily as per PCP. Side effects were discussed  Recommend good control of cardiovascular risk factors.  Patient informed of abnormal blood pressure Continue to control mood as per PCP .   Discussed the use of AI scribe software for clinical note transcription with the patient, who gave verbal consent to proceed.  History of Present Illness Rita Diaz is a 68 year old female accompanied by her son. She was referred for evaluation of potential underlying memory issues following an episode of paranoia and confusion.  She experienced an episode of paranoia and confusion, leading to hospitalization. She believed her husband was having an affair with a woman named Catering Manager, a belief persisting for about three years, with a similar incident involving another  woman two years prior. Her daughter took her to the emergency department after she became disoriented and lost while driving from [place]  to [place]. Her son notes these episodes occur more frequently at night.  She reports her memory is 'eighty percent' good, with no significant short-term memory issues or disorientation at home. She occasionally misplaces her phone but not in unusual places. No wandering tendencies or significant personality changes are noted.  She is currently taking Abilify for mood stabilization, Wellbutrin for anxiety, and donepezil for memory. Her son notes these medications have helped control her symptoms, and she has been sleeping better without nightmares or vivid dreams.She has no history of seizures, sleep apnea, schizophrenia, or bipolar disorder.   She has a history  chronic pain. She attended a pain clinic and recently fell down stairs, resulting in back pain. She visited a spine doctor and completed physical therapy, which helped with strength and mobility.  No head injuries.  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 Her son and husband manage her medications and finances. She does not drive following the September incident. There is no family history of dementia. She does not smoke or consume alcohol heavily.      Recent labs October 2025 normal CBC, normal CMP, TSH 1.520 A1c 5.6 normal UA   MRI of the brain performed at [facility] 05/15/2024 (no disc) remarkable for mild cerebral atrophy with disproportionate atrophy seen in the right medial temporal lobe hippocampi, moderate chronic small vessel ischemic disease, no acute intracranial pathologies.  Allergies[1]  Current Outpatient Medications  Medication Instructions   amoxicillin -clavulanate (AUGMENTIN ) 875-125 MG tablet 1 tablet, 2 times daily  hydrochlorothiazide  (HYDRODIURIL ) 25 MG tablet TAKE ONE TABLET BY MOUTH DAILY   ondansetron  (ZOFRAN ) 4 mg, Oral, Every 8 hours PRN   OVER THE COUNTER  MEDICATION Energy, Metabolism, bone, Immune support      VITALS:   Vitals:   07/10/24 0959 07/10/24 1046  BP: (!) 190/98 (!) 198/102  Pulse: 72   SpO2: 100%   Weight: 183 lb (83 kg)   Height: 5' 4.5 (1.638 m)      Neurological Exam     07/10/2024   10:00 AM  Montreal Cognitive Assessment   Visuospatial/ Executive (0/5) 1  Naming (0/3) 2  Attention: Read list of digits (0/2) 2  Attention: Read list of letters (0/1) 1  Attention: Serial 7 subtraction starting at 100 (0/3) 1  Language: Repeat phrase (0/2) 1  Language : Fluency (0/1) 0  Abstraction (0/2) 0  Delayed Recall (0/5) 2  Orientation (0/6) 6  Total 16  Adjusted Score (based on education) 17        No data to display             Orientation:  Alert and oriented to person, place and to time . No aphasia or dysarthria. Fund of knowledge is appropriate. Recent and remote memory impaired.  Attention and concentration are reduced.  Able to name objects and repeat phrases 1/2.  Delayed recall 2 /5 .  Cranial nerves: There is good facial symmetry. Tearful throughout the visit. Extraocular muscles are intact and visual fields are full to confrontational testing. Speech is fluent and clear. No tongue deviation. Hearing is intact to conversational tone.  Tone: Tone is good throughout. Sensation: Sensation is intact to light touch.  Vibration is intact at the bilateral big toe.  Coordination: The patient has no difficulty with RAM's or FNF bilaterally. Normal finger to nose  Motor: Strength is 5/5 in the bilateral upper and lower extremities. There is no pronator drift. There are no fasciculations noted. DTR's: Deep tendon reflexes are 2/4 bilaterally. Gait and Station: The patient is able to ambulate without difficulty. Gait is cautious and narrow, slowed pace. Stride length is normal.No ataxia noted        Thank you for allowing us  the opportunity to participate in the care of this nice patient. Please do not hesitate  to contact us  for any questions or concerns.   Total time spent on today's visit was 45 minutes dedicated to this patient today, preparing to see patient, examining the patient, ordering tests and/or medications and counseling the patient, documenting clinical information in the EHR or other health record, independently interpreting results and communicating results to the patient/family, discussing treatment and goals, answering patient's questions and coordinating care.  Cc:  Patient, No Pcp Per  Camie Sevin 07/10/2024 12:51 PM       [1]  Allergies Allergen Reactions   Aspirin Other (See Comments)    Makes her burn all over.  Other reaction(s): Other (See Comments)  Makes her burn all over.  Makes stomach burn  Makes stomach burn    Other reaction(s): Other (See Comments) Makes her burn all over. Makes stomach burn  Makes her burn all over.  Other reaction(s): Other (See Comments) Makes her burn all over. Makes stomach burn    Makes her burn all over.   Elavil [Amitriptyline Hcl]     Hypersomnolence and weight gain   "

## 2024-07-10 ENCOUNTER — Ambulatory Visit: Payer: Self-pay | Admitting: Physician Assistant

## 2024-07-10 VITALS — BP 198/102 | HR 72 | Ht 64.5 in | Wt 183.0 lb

## 2024-07-10 DIAGNOSIS — R413 Other amnesia: Secondary | ICD-10-CM | POA: Diagnosis not present

## 2024-07-22 ENCOUNTER — Encounter: Payer: Self-pay | Admitting: Psychology

## 2024-07-23 ENCOUNTER — Ambulatory Visit: Admitting: Psychology

## 2024-07-23 ENCOUNTER — Encounter: Payer: Self-pay | Admitting: Psychology

## 2024-07-23 DIAGNOSIS — R4189 Other symptoms and signs involving cognitive functions and awareness: Secondary | ICD-10-CM

## 2024-07-23 DIAGNOSIS — F02A2 Dementia in other diseases classified elsewhere, mild, with psychotic disturbance: Secondary | ICD-10-CM

## 2024-07-23 DIAGNOSIS — F04 Amnestic disorder due to known physiological condition: Secondary | ICD-10-CM

## 2024-07-23 DIAGNOSIS — G309 Alzheimer's disease, unspecified: Secondary | ICD-10-CM | POA: Diagnosis not present

## 2024-07-23 DIAGNOSIS — F03A0 Unspecified dementia, mild, without behavioral disturbance, psychotic disturbance, mood disturbance, and anxiety: Secondary | ICD-10-CM | POA: Insufficient documentation

## 2024-07-23 NOTE — Progress Notes (Unsigned)
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 billed as one unit [phone] and three additional units [id number]. Psychometrist Lonell Jude, B.S. assisted Dr. Maryland with test administration and scoring procedures. As a separate and discrete service, one unit [phone] and two units 96133 (160 minutes) were billed for Dr. Loralee time spent in interpretation and report writing.      "

## 2024-07-23 NOTE — Progress Notes (Signed)
" ° °  Psychometrician Note   Cognitive testing was administered to Rita Diaz by Lonell Jude, B.S. (psychometrist) under the supervision of Dr. Arthea KYM [place], Ph.D., ABPP, licensed psychologist on 07/23/2024. Rita Diaz did not appear overtly distressed by the testing session per behavioral observation or responses across self-report questionnaires. Rest breaks were offered.   The battery of tests administered was selected by Dr. Zachary C. Merz, Ph.D., ABPP with consideration to Rita Diaz's current level of functioning, the nature of her symptoms, emotional and behavioral responses during interview, level of literacy, observed level of motivation/effort, and the nature of the referral question. This battery was communicated to the psychometrist. Communication between Dr. Arthea KYM [place], Ph.D., ABPP and the psychometrist was ongoing throughout the evaluation and Dr. Arthea KYM [place], Ph.D., ABPP was immediately accessible at all times. Dr. Zachary C. Merz, Ph.D., ABPP provided supervision to the psychometrist on the date of this service to the extent necessary to assure the quality of all services provided.    Rita Diaz will return within approximately 1-2 weeks for an interactive feedback session with Dr. Maryland at which time her test performances, clinical impressions, and treatment recommendations will be reviewed in detail. Rita Diaz understands she can contact our office should she require our assistance before this time.  A total of 135 minutes of billable time were spent face-to-face with Rita Diaz by the psychometrist. This includes both test administration and scoring time. Billing for these services is reflected in the clinical report generated by Dr. Arthea KYM [place], Ph.D., ABPP  This note reflects time spent with the psychometrician and does not include test scores or any clinical interpretations made by Dr. Maryland. The full report will follow in a separate note. "

## 2024-07-24 ENCOUNTER — Encounter: Payer: Self-pay | Admitting: Psychology

## 2024-07-26 NOTE — Progress Notes (Signed)
 Date of service: 07/26/2024  SURGEON:   Rockey Pae, MD     PREOPERATIVE DIAGNOSIS: Lumbar facet arthropathy chronic; lumbar spondylosis without radiculopathy chronic  POSTOPERATIVE DIAGNOSIS: Lumbar facet arthropathy chronic; lumbar spondylosis without radiculopathy chronic   PROCEDURE: LUMBAR FACET JOINT INJECTION UTILIZING THE MEDIAL BRANCH TECHNIQUE WITH FLUOROSCOPY  LEVELS TREATED:   Bilateral L4-5 and L5-S1  with needles at Lumbar 4 and 5and sacral ala  BLOOD LOSS: minimal  COMPLICATIONS: none  THERAPEUTIC AGENT: 0.5cc 0.25% bupivacaine  per needle   Procedure No: 1/2  DESCRIPTION:  The patient gave informed consent. Patient had failed conservative management including physical therapy, and had no previous posterior fusion at treated levels.  The patient was taken to the procedure room, placed in the prone position and back was prepped with antiseptic solution and draped in the usual fashion. Using sterile technique with fluoroscopic guidance the junction of the superior articulating process and transverse process was obtained, a sterile styletted spinal needle was advanced into the region, adjacent to the facet joint in order to block the ascending and descending medial branch nerves at corresponding level.  Once needle was appropriately positioned, aspiration was negative for heme or air. Therapeutic agent was administered in increments. Procedure was then repeated in identical manner listed above with separate needles at all treated levels.  All needles were removed intact.   The patient tolerated the procedure well and recovered uneventfully in the pain center recovery room. The procedure was performed as stated above. Discharge instructions were given.   ADDENDUM: Fluoroscopy time and Exposure, mRad were recorded and scanned into chart for procedure. Image count stored.  **levels based on palpation of painful areas under fluoroscopy  This patient has had moderate to severe  neck/low back pain that is predominately axial.  This pain has caused functional deficits and results in pain which is >/= to 4/10 on the VAS scale.  The pain has been present for >6 weeks and has not responded to conservative therapy including physical therapy, home exercise program, medication management, rest and passage of time.  After review of the applicable imaging and physical exam we deem the current pain to be facet joint mediated.  Therefore, we will proceed with facet joint injections utilizing the medial branch technique.  If Rita Diaz finds benefit of >80% with two sets of medial branch blocks, we will plan to proceed with radiofrequency ablation of the previously treated nerves.

## 2024-07-29 ENCOUNTER — Encounter: Admitting: Psychology

## 2024-07-29 DIAGNOSIS — G309 Alzheimer's disease, unspecified: Secondary | ICD-10-CM | POA: Diagnosis not present

## 2024-07-29 DIAGNOSIS — F04 Amnestic disorder due to known physiological condition: Secondary | ICD-10-CM | POA: Diagnosis not present

## 2024-07-29 DIAGNOSIS — F02A2 Dementia in other diseases classified elsewhere, mild, with psychotic disturbance: Secondary | ICD-10-CM

## 2024-07-29 NOTE — Progress Notes (Signed)
"  ° °  Neuropsychology Feedback Session Rita Diaz. [facility] [place] Department of Neurology  Reason for Referral:   Rita Diaz is a 68 y.o. right-handed Caucasian female referred by Camie Sevin, PA-C, to characterize her current cognitive functioning and assist with diagnostic clarity and treatment planning in the context of subjective cognitive decline.   Feedback:   Rita Diaz completed a comprehensive neuropsychological evaluation on 07/23/2024. Please refer to that encounter for the full report and recommendations. Briefly, results suggested significant impairment surrounding complex attention, executive functioning, verbal fluency (semantic worse than phonemic), and both encoding (i.e., learning) and delayed retrieval aspects of memory. Further weaknesses were exhibited across confrontation naming and recognition/consolidation aspects of memory, while variability was exhibited across visuospatial abilities. Performances were appropriate relative to age-matched peers across processing speed and basic attention. The cause for her mild dementia presentation remains somewhat uncertain. With that being said, I do have concerns surrounding an underlying neurodegenerative illness, namely Alzheimer's disease. Across memory testing, Rita Diaz did not benefit from repeated learning trials, exhibited retention rates ranging from 0% to 33% across memory tasks after a brief delay, and performed poorly across follow-up recognition trials. This suggests evidence for rapid forgetting and an evolving and already fairly prominent storage impairment, both of which are the hallmark testing patterns commonly associated with Alzheimer's disease. Further weakness surrounding semantic fluency, confrontation naming, and executive functioning would follow typical disease progression. Furthermore, her recent brain MRI performed in October 2025 suggested medial temporal lobe atrophy, predominantly impacting the right  hippocampi, which is an established risk factor for this illness being present. Overall, concerns for this illness remain. Continued medical monitoring will be important moving forward.   Rita Diaz was accompanied by her husband and son during the current feedback session. Content of the current session focused on the results of her neuropsychological evaluation. Rita Diaz was given the opportunity to ask questions and her questions were answered. She was encouraged to reach out should additional questions arise. A copy of her report was provided at the conclusion of the visit.      One unit 96132 (34 minutes) was billed for Dr. Loralee time spent preparing for, conducting, and documenting the current feedback session with Rita Diaz. "

## 2024-07-30 ENCOUNTER — Telehealth: Payer: Self-pay | Admitting: Physician Assistant

## 2024-07-30 NOTE — Telephone Encounter (Signed)
 Left message to call the office, unsure what is needed on this call

## 2024-07-30 NOTE — Telephone Encounter (Signed)
 Caller states was seen and had tests, husband has never been with her when seen in this office. Some of the paperwork is incorrect. Spoke w/her and that person said she did great. Paperwork says denied a lot of things.

## 2024-07-31 ENCOUNTER — Ambulatory Visit: Admitting: Physician Assistant

## 2024-08-06 ENCOUNTER — Institutional Professional Consult (permissible substitution): Payer: Self-pay | Admitting: Psychology

## 2024-08-06 ENCOUNTER — Ambulatory Visit: Payer: Self-pay

## 2024-08-06 ENCOUNTER — Ambulatory Visit

## 2024-08-06 ENCOUNTER — Institutional Professional Consult (permissible substitution): Admitting: Psychology

## 2024-08-13 ENCOUNTER — Telehealth: Payer: Self-pay | Admitting: Physician Assistant

## 2024-08-13 NOTE — Telephone Encounter (Signed)
 Chad called in in regards of his mom Rita Diaz. He stated that she may have had a mini stroke. Their uncle passed away on 09/08/24. She stood up, in a daze. Over the weekend  she had a hard time moving around. Chad has some concerns. He is wanting to know if this was something that would be seen sooner.   PH: [phone]

## 2024-08-13 NOTE — Telephone Encounter (Signed)
 Called Rita Diaz and he had concerns about a mini stoke. I encouraged him to go to primary or the ED for her to get checked out and to get them to check for a UTI. He understood and said he would.

## 2024-08-28 ENCOUNTER — Encounter: Payer: Self-pay | Admitting: Psychology

## 2024-08-28 ENCOUNTER — Ambulatory Visit: Payer: Self-pay | Admitting: Psychology

## 2024-09-05 ENCOUNTER — Ambulatory Visit: Payer: Self-pay | Admitting: Physician Assistant

## 2024-09-05 ENCOUNTER — Ambulatory Visit: Admitting: Physician Assistant
